# Patient Record
Sex: Female | Born: 1985 | Race: White | Hispanic: No | Marital: Married | Smoking: Never smoker
Health system: Southern US, Community
[De-identification: ages and names within clinical notes are randomized; demographics above are authoritative.]

## PROBLEM LIST (undated history)

## (undated) ENCOUNTER — Inpatient Hospital Stay: Payer: Self-pay

## (undated) DIAGNOSIS — I471 Supraventricular tachycardia: Secondary | ICD-10-CM

## (undated) DIAGNOSIS — I4719 Other supraventricular tachycardia: Secondary | ICD-10-CM

## (undated) HISTORY — PX: ATRIAL ABLATION SURGERY: SHX560

## (undated) HISTORY — PX: OTHER SURGICAL HISTORY: SHX169

---
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
## 2007-04-29 DIAGNOSIS — R002 Palpitations: Secondary | ICD-10-CM | POA: Insufficient documentation

## 2007-04-30 DIAGNOSIS — I498 Other specified cardiac arrhythmias: Secondary | ICD-10-CM | POA: Insufficient documentation

## 2007-07-18 DIAGNOSIS — R079 Chest pain, unspecified: Secondary | ICD-10-CM | POA: Insufficient documentation

## 2008-06-02 ENCOUNTER — Emergency Department: Payer: Self-pay | Admitting: Emergency Medicine

## 2008-09-04 ENCOUNTER — Emergency Department: Payer: Self-pay | Admitting: Unknown Physician Specialty

## 2008-12-06 ENCOUNTER — Observation Stay: Payer: Self-pay

## 2009-01-12 ENCOUNTER — Emergency Department: Payer: Self-pay | Admitting: Unknown Physician Specialty

## 2009-01-13 ENCOUNTER — Emergency Department: Payer: Self-pay | Admitting: Unknown Physician Specialty

## 2009-01-31 ENCOUNTER — Observation Stay: Payer: Self-pay | Admitting: Obstetrics and Gynecology

## 2009-02-14 ENCOUNTER — Observation Stay: Payer: Self-pay

## 2009-02-15 ENCOUNTER — Observation Stay: Payer: Self-pay | Admitting: Unknown Physician Specialty

## 2009-02-25 ENCOUNTER — Observation Stay: Payer: Self-pay | Admitting: Emergency Medicine

## 2009-03-07 ENCOUNTER — Observation Stay: Payer: Self-pay

## 2009-03-25 ENCOUNTER — Inpatient Hospital Stay: Payer: Self-pay | Admitting: Obstetrics & Gynecology

## 2010-12-19 ENCOUNTER — Emergency Department: Payer: Self-pay | Admitting: Unknown Physician Specialty

## 2011-07-25 ENCOUNTER — Emergency Department: Payer: Self-pay | Admitting: Emergency Medicine

## 2011-07-25 LAB — COMPREHENSIVE METABOLIC PANEL
Albumin: 4.2 g/dL (ref 3.4–5.0)
Alkaline Phosphatase: 60 U/L (ref 50–136)
Bilirubin,Total: 0.4 mg/dL (ref 0.2–1.0)
Co2: 23 mmol/L (ref 21–32)
Creatinine: 0.73 mg/dL (ref 0.60–1.30)
EGFR (Non-African Amer.): >60
Glucose: 110 mg/dL — ABNORMAL HIGH (ref 65–99)
Osmolality: 285 (ref 275–301)
SGPT (ALT): 18 U/L
Sodium: 143 mmol/L (ref 136–145)
Total Protein: 7.4 g/dL (ref 6.4–8.2)

## 2011-07-25 LAB — URINALYSIS, COMPLETE
Nitrite: NEGATIVE
Ph: 5 (ref 4.5–8.0)
Protein: 100
Specific Gravity: 1.028 (ref 1.003–1.030)
WBC UR: 9 /HPF (ref 0–5)

## 2011-07-25 LAB — PREGNANCY, URINE: Pregnancy Test, Urine: NEGATIVE m[IU]/mL

## 2011-07-25 LAB — CBC
HCT: 40.8 % (ref 35.0–47.0)
HGB: 13.8 g/dL (ref 12.0–16.0)
MCH: 29.3 pg (ref 26.0–34.0)
MCHC: 33.9 g/dL (ref 32.0–36.0)
MCV: 86 fL (ref 80–100)
Platelet: 175 10*3/uL (ref 150–440)
RBC: 4.72 10*6/uL (ref 3.80–5.20)

## 2011-07-25 LAB — WET PREP, GENITAL

## 2013-01-22 ENCOUNTER — Ambulatory Visit: Payer: Self-pay | Admitting: Podiatry

## 2015-03-14 ENCOUNTER — Other Ambulatory Visit: Payer: Self-pay | Admitting: Family Medicine

## 2015-03-14 ENCOUNTER — Ambulatory Visit
Admission: RE | Admit: 2015-03-14 | Discharge: 2015-03-14 | Disposition: A | Discharge status: Home / Self Care | Payer: 59 | Source: Ambulatory Visit | Attending: Family Medicine | Admitting: Family Medicine

## 2015-03-14 DIAGNOSIS — R221 Localized swelling, mass and lump, neck: Secondary | ICD-10-CM

## 2015-03-14 DIAGNOSIS — R590 Localized enlarged lymph nodes: Secondary | ICD-10-CM | POA: Insufficient documentation

## 2015-04-06 ENCOUNTER — Emergency Department (HOSPITAL_COMMUNITY)
Admission: EM | Admit: 2015-04-06 | Discharge: 2015-04-07 | Disposition: A | Discharge status: Home / Self Care | Payer: 59 | Attending: Emergency Medicine | Admitting: Emergency Medicine

## 2015-04-06 ENCOUNTER — Encounter (HOSPITAL_COMMUNITY): Payer: Self-pay | Admitting: Emergency Medicine

## 2015-04-06 ENCOUNTER — Emergency Department (HOSPITAL_COMMUNITY): Payer: 59

## 2015-04-06 DIAGNOSIS — Y9389 Activity, other specified: Secondary | ICD-10-CM | POA: Diagnosis not present

## 2015-04-06 DIAGNOSIS — Y998 Other external cause status: Secondary | ICD-10-CM | POA: Insufficient documentation

## 2015-04-06 DIAGNOSIS — S93401A Sprain of unspecified ligament of right ankle, initial encounter: Secondary | ICD-10-CM | POA: Diagnosis not present

## 2015-04-06 DIAGNOSIS — Y9289 Other specified places as the place of occurrence of the external cause: Secondary | ICD-10-CM | POA: Insufficient documentation

## 2015-04-06 DIAGNOSIS — W1841XA Slipping, tripping and stumbling without falling due to stepping on object, initial encounter: Secondary | ICD-10-CM | POA: Insufficient documentation

## 2015-04-06 DIAGNOSIS — S99911A Unspecified injury of right ankle, initial encounter: Secondary | ICD-10-CM | POA: Diagnosis present

## 2015-04-06 NOTE — ED Provider Notes (Signed)
CSN: [id number]     Arrival date & time 04/06/15  2250 History  By signing my name below, I, Lyndel Safe, attest that this documentation has been prepared under the direction and in the presence of Ok Edwards, PA-C. Electronically Signed: Lyndel Safe, ED Scribe. 04/06/2015. 11:47 PM.   Chief Complaint  Patient presents with  . Ankle Injury   The history is provided by the patient. No language interpreter was used.   HPI Comments: Alison Myers is a 29 y.o. female who presents to the Emergency Department complaining of sudden onset, constant, throbbing, 7/10, lateral right ankle pain and swelling s/p twist injury that occurred PTA. Pt reports she tripped while stepping over a rope and twisted her right ankle hearing a snap in the process. Pt is ambulating with a family member's crutches. No wound to right ankle.   History reviewed. No pertinent past medical history. History reviewed. No pertinent past surgical history. No family history on file. Social History  Substance Use Topics  . Smoking status: Never Smoker   . Smokeless tobacco: None  . Alcohol Use: Yes   OB History    No data available     Review of Systems  Musculoskeletal: Positive for joint swelling ( right ankle) and arthralgias ( right ankle).  Skin: Negative for wound.  All other systems reviewed and are negative.  Allergies  Demerol  Home Medications   Prior to Admission medications   Not on File   BP 106/75 mmHg  Pulse 87  Temp(Src) 98.7 F (37.1 C)  Resp 16  SpO2 100%  LMP 04/04/2015 Physical Exam  Constitutional: She is oriented to person, place, and time. She appears well-developed and well-nourished. No distress.  HENT:  Head: Normocephalic.  Eyes: Conjunctivae are normal.  Neck: Normal range of motion. Neck supple.  Cardiovascular: Normal rate.   Pulmonary/Chest: Effort normal. No respiratory distress.  Musculoskeletal: Normal range of motion.  Neurological: She is alert and  oriented to person, place, and time. Coordination normal.  Skin: Skin is warm.  Psychiatric: She has a normal mood and affect. Her behavior is normal.  Nursing note and vitals reviewed.   ED Course  Procedures  DIAGNOSTIC STUDIES: Oxygen Saturation is 100% on RA, normal by my interpretation.    COORDINATION OF CARE: 11:47 PM Discussed treatment plan with pt at bedside and pt agreed to plan. Advised RICE therapy and ibuprofen.   Imaging Review Dg Ankle Complete Right  04/06/2015  CLINICAL DATA:  Acute onset of right ankle pain and swelling after twisting injury. Initial encounter. EXAM: RIGHT ANKLE - COMPLETE 3+ VIEW COMPARISON:  None. FINDINGS: There is no evidence of fracture or dislocation. The ankle mortise is intact; the interosseous space is within normal limits. No talar tilt or subluxation is seen. The joint spaces are preserved. No significant soft tissue abnormalities are seen. IMPRESSION: No evidence of fracture or dislocation. Electronically Signed   By: Roanna Raider M.D.   On: 04/06/2015 23:29   I have personally reviewed and evaluated these images as part of my medical decision-making.  MDM   Final diagnoses:  Ankle sprain, right, initial encounter    aso Crutches Ibuprofen    Elson Areas, PA-C 04/06/15 2358  Gwyneth Sprout, MD 04/09/15 [phone]

## 2015-04-06 NOTE — ED Notes (Signed)
Pt. tripped and injured her right ankle with pain and swelling this evening .

## 2015-04-06 NOTE — Discharge Instructions (Signed)
Cryotherapy °Cryotherapy means treatment with cold. Ice or gel packs can be used to reduce both pain and swelling. Ice is the most helpful within the first 24 to 48 hours after an injury or flare-up from overusing a muscle or joint. Sprains, strains, spasms, burning pain, shooting pain, and aches can all be eased with ice. Ice can also be used when recovering from surgery. Ice is effective, has very few side effects, and is safe for most people to use. °PRECAUTIONS  °Ice is not a safe treatment option for people with: °· Raynaud phenomenon. This is a condition affecting small blood vessels in the extremities. Exposure to cold may cause your problems to return. °· Cold hypersensitivity. There are many forms of cold hypersensitivity, including: °· Cold urticaria. Red, itchy hives appear on the skin when the tissues begin to warm after being iced. °· Cold erythema. This is a red, itchy rash caused by exposure to cold. °· Cold hemoglobinuria. Red blood cells break down when the tissues begin to warm after being iced. The hemoglobin that carry oxygen are passed into the urine because they cannot combine with blood proteins fast enough. °· Numbness or altered sensitivity in the area being iced. °If you have any of the following conditions, do not use ice until you have discussed cryotherapy with your caregiver: °· Heart conditions, such as arrhythmia, angina, or chronic heart disease. °· High blood pressure. °· Healing wounds or open skin in the area being iced. °· Current infections. °· Rheumatoid arthritis. °· Poor circulation. °· Diabetes. °Ice slows the blood flow in the region it is applied. This is beneficial when trying to stop inflamed tissues from spreading irritating chemicals to surrounding tissues. However, if you expose your skin to cold temperatures for too long or without the proper protection, you can damage your skin or nerves. Watch for signs of skin damage due to cold. °HOME CARE INSTRUCTIONS °Follow  these tips to use ice and cold packs safely. °· Place a dry or damp towel between the ice and skin. A damp towel will cool the skin more quickly, so you may need to shorten the time that the ice is used. °· For a more rapid response, add gentle compression to the ice. °· Ice for no more than 10 to 20 minutes at a time. The bonier the area you are icing, the less time it will take to get the benefits of ice. °· Check your skin after 5 minutes to make sure there are no signs of a poor response to cold or skin damage. °· Rest 20 minutes or more between uses. °· Once your skin is numb, you can end your treatment. You can test numbness by very lightly touching your skin. The touch should be so light that you do not see the skin dimple from the pressure of your fingertip. When using ice, most people will feel these normal sensations in this order: cold, burning, aching, and numbness. °· Do not use ice on someone who cannot communicate their responses to pain, such as small children or people with dementia. °HOW TO MAKE AN ICE PACK °Ice packs are the most common way to use ice therapy. Other methods include ice massage, ice baths, and cryosprays. Muscle creams that cause a cold, tingly feeling do not offer the same benefits that ice offers and should not be used as a substitute unless recommended by your caregiver. °To make an ice pack, do one of the following: °· Place crushed ice or a   bag of frozen vegetables in a sealable plastic bag. Squeeze out the excess air. Place this bag inside another plastic bag. Slide the bag into a pillowcase or place a damp towel between your skin and the bag. °· Mix 3 parts water with 1 part rubbing alcohol. Freeze the mixture in a sealable plastic bag. When you remove the mixture from the freezer, it will be slushy. Squeeze out the excess air. Place this bag inside another plastic bag. Slide the bag into a pillowcase or place a damp towel between your skin and the bag. °SEEK MEDICAL CARE  IF: °· You develop white spots on your skin. This may give the skin a blotchy (mottled) appearance. °· Your skin turns blue or pale. °· Your skin becomes waxy or hard. °· Your swelling gets worse. °MAKE SURE YOU:  °· Understand these instructions. °· Will watch your condition. °· Will get help right away if you are not doing well or get worse. °  °This information is not intended to replace advice given to you by your health care provider. Make sure you discuss any questions you have with your health care provider. °  °Document Released: 01/29/2011 Document Revised: 06/25/2014 Document Reviewed: 01/29/2011 °Elsevier Interactive Patient Education ©2016 [employer]vier Inc[employer] ° °Ankle Sprain °An ankle sprain is an injury to the strong, fibrous tissues (ligaments) that hold the bones of your ankle joint together.  °CAUSES °An ankle sprain is usually caused by a fall or by twisting your ankle. Ankle sprains most commonly occur when you step on the outer edge of your foot, and your ankle turns inward. People who participate in sports are more prone to these types of injuries.  °SYMPTOMS  °· Pain in your ankle. The pain may be present at rest or only when you are trying to stand or walk. °· Swelling. °· Bruising. Bruising may develop immediately or within 1 to 2 days after your injury. °· Difficulty standing or walking, particularly when turning corners or changing directions. °DIAGNOSIS  °Your caregiver will ask you details about your injury and perform a physical exam of your ankle to determine if you have an ankle sprain. During the physical exam, your caregiver will press on and apply pressure to specific areas of your foot and ankle. Your caregiver will try to move your ankle in certain ways. An X-ray exam may be done to be sure a bone was not broken or a ligament did not separate from one of the bones in your ankle (avulsion fracture).  °TREATMENT  °Certain types of braces can help stabilize your ankle. Your caregiver can  make a recommendation for this. Your caregiver may recommend the use of medicine for pain. If your sprain is severe, your caregiver may refer you to a surgeon who helps to restore function to parts of your skeletal system (orthopedist) or a physical therapist. °HOME CARE INSTRUCTIONS  °· Apply ice to your injury for 1-2 days or as directed by your caregiver. Applying ice helps to reduce inflammation and pain. °¨ Put ice in a plastic bag. °¨ Place a towel between your skin and the bag. °¨ Leave the ice on for 15-20 minutes at a time, every 2 hours while you are awake. °· Only take over-the-counter or prescription medicines for pain, discomfort, or fever as directed by your caregiver. °· Elevate your injured ankle above the level of your heart as much as possible for 2-3 days. °· If your caregiver recommends crutches, use them as instructed. Gradually put weight   on the affected ankle. Continue to use crutches or a cane until you can walk without feeling pain in your ankle. °· If you have a plaster splint, wear the splint as directed by your caregiver. Do not rest it on anything harder than a pillow for the first 24 hours. Do not put weight on it. Do not get it wet. You may take it off to take a shower or bath. °· You may have been given an elastic bandage to wear around your ankle to provide support. If the elastic bandage is too tight (you have numbness or tingling in your foot or your foot becomes cold and blue), adjust the bandage to make it comfortable. °· If you have an air splint, you may blow more air into it or let air out to make it more comfortable. You may take your splint off at night and before taking a shower or bath. Wiggle your toes in the splint several times per day to decrease swelling. °SEEK MEDICAL CARE IF:  °· You have rapidly increasing bruising or swelling. °· Your toes feel extremely cold or you lose feeling in your foot. °· Your pain is not relieved with medicine. °SEEK IMMEDIATE MEDICAL CARE  IF: °· Your toes are numb or blue. °· You have severe pain that is increasing. °MAKE SURE YOU:  °· Understand these instructions. °· Will watch your condition. °· Will get help right away if you are not doing well or get worse. °  °This information is not intended to replace advice given to you by your health care provider. Make sure you discuss any questions you have with your health care provider. °  °Document Released: 06/04/2005 Document Revised: 06/25/2014 Document Reviewed: 06/16/2011 °Elsevier Interactive Patient Education ©2016 Elsevier Inc. ° °

## 2015-04-06 NOTE — ED Notes (Signed)
Patient transported to X-ray 

## 2015-04-07 MED ORDER — IBUPROFEN 400 MG PO TABS
800.0000 mg | ORAL_TABLET | Freq: Once | ORAL | Status: AC
  Administered 2015-04-07: 800 mg via ORAL
  Filled 2015-04-07: qty 2

## 2015-04-07 NOTE — ED Notes (Signed)
Patient is alert and orientedx4.  Patient was explained discharge instructions and they understood them with no questions.   

## 2015-08-04 ENCOUNTER — Emergency Department
Admission: EM | Admit: 2015-08-04 | Discharge: 2015-08-04 | Disposition: A | Discharge status: Home / Self Care | Payer: Commercial Managed Care - PPO | Attending: Emergency Medicine | Admitting: Emergency Medicine

## 2015-08-04 ENCOUNTER — Encounter: Payer: Self-pay | Admitting: *Deleted

## 2015-08-04 DIAGNOSIS — R51 Headache: Secondary | ICD-10-CM | POA: Diagnosis not present

## 2015-08-04 DIAGNOSIS — Z3A13 13 weeks gestation of pregnancy: Secondary | ICD-10-CM | POA: Diagnosis not present

## 2015-08-04 DIAGNOSIS — O9989 Other specified diseases and conditions complicating pregnancy, childbirth and the puerperium: Secondary | ICD-10-CM | POA: Insufficient documentation

## 2015-08-04 DIAGNOSIS — R079 Chest pain, unspecified: Secondary | ICD-10-CM | POA: Insufficient documentation

## 2015-08-04 DIAGNOSIS — R519 Headache, unspecified: Secondary | ICD-10-CM

## 2015-08-04 HISTORY — DX: Supraventricular tachycardia: I47.1

## 2015-08-04 HISTORY — DX: Other supraventricular tachycardia: I47.19

## 2015-08-04 LAB — CBC
HEMATOCRIT: 35.6 % (ref 35.0–47.0)
Hemoglobin: 12.3 g/dL (ref 12.0–16.0)
MCH: 28.4 pg (ref 26.0–34.0)
MCHC: 34.7 g/dL (ref 32.0–36.0)
MCV: 82 fL (ref 80.0–100.0)
PLATELETS: 214 10*3/uL (ref 150–440)
RBC: 4.34 MIL/uL (ref 3.80–5.20)
RDW: 12.9 % (ref 11.5–14.5)
WBC: 9 10*3/uL (ref 3.6–11.0)

## 2015-08-04 LAB — BASIC METABOLIC PANEL
Anion gap: 7 (ref 5–15)
BUN: 8 mg/dL (ref 6–20)
CO2: 22 mmol/L (ref 22–32)
Calcium: 9 mg/dL (ref 8.9–10.3)
Chloride: 106 mmol/L (ref 101–111)
Creatinine, Ser: 0.43 mg/dL — ABNORMAL LOW (ref 0.44–1.00)
Glucose, Bld: 87 mg/dL (ref 65–99)
Potassium: 3.5 mmol/L (ref 3.5–5.1)
SODIUM: 135 mmol/L (ref 135–145)

## 2015-08-04 LAB — TROPONIN I: Troponin I: <0.03 ng/mL (ref <0.031)

## 2015-08-04 MED ORDER — ACETAMINOPHEN 500 MG PO TABS
1000.0000 mg | ORAL_TABLET | Freq: Once | ORAL | Status: AC
  Administered 2015-08-04: 1000 mg via ORAL
  Filled 2015-08-04: qty 2

## 2015-08-04 MED ORDER — METOCLOPRAMIDE HCL 10 MG PO TABS: 10.0000 mg | ORAL_TABLET | Freq: Three times a day (TID) | ORAL | Status: DC

## 2015-08-04 MED ORDER — METOCLOPRAMIDE HCL 5 MG/ML IJ SOLN
10.0000 mg | Freq: Once | INTRAMUSCULAR | Status: AC
  Administered 2015-08-04: 10 mg via INTRAVENOUS
  Filled 2015-08-04: qty 2

## 2015-08-04 MED ORDER — HYDROMORPHONE HCL 1 MG/ML IJ SOLN
0.5000 mg | Freq: Once | INTRAMUSCULAR | Status: AC
  Administered 2015-08-04: 0.5 mg via INTRAVENOUS
  Filled 2015-08-04: qty 1

## 2015-08-04 MED ORDER — SODIUM CHLORIDE 0.9 % IV BOLUS (SEPSIS)
1000.0000 mL | Freq: Once | INTRAVENOUS | Status: AC
  Administered 2015-08-04: 1000 mL via INTRAVENOUS

## 2015-08-04 NOTE — Discharge Instructions (Signed)
General Headache Without Cause °A headache is pain or discomfort felt around the head or neck area. There are many causes and types of headaches. In some cases, the cause may not be found.  °HOME CARE  °Managing Pain °· Take over-the-counter and prescription medicines only as told by your doctor. °· Lie down in a dark, quiet room when you have a headache. °· If directed, apply ice to the head and neck area: °¨ Put ice in a plastic bag. °¨ Place a towel between your skin and the bag. °¨ Leave the ice on for 20 minutes, 2-3 times per day. °· Use a heating pad or hot shower to apply heat to the head and neck area as told by your doctor. °· Keep lights dim if bright lights bother you or make your headaches worse. °Eating and Drinking °· Eat meals on a regular schedule. °· Lessen how much alcohol you drink. °· Lessen how much caffeine you drink, or stop drinking caffeine. °General Instructions °· Keep all follow-up visits as told by your doctor. This is important. °· Keep a journal to find out if certain things bring on headaches. For example, write down: °¨ What you eat and drink. °¨ How much sleep you get. °¨ Any change to your diet or medicines. °· Relax by getting a massage or doing other relaxing activities. °· Lessen stress. °· Sit up straight. Do not tighten (tense) your muscles. °· Do not use tobacco products. This includes cigarettes, chewing tobacco, or e-cigarettes. If you need help quitting, ask your doctor. °· Exercise regularly as told by your doctor. °· Get enough sleep. This often means 7-9 hours of sleep. °GET HELP IF: °· Your symptoms are not helped by medicine. °· You have a headache that feels different than the other headaches. °· You feel sick to your stomach (nauseous) or you throw up (vomit). °· You have a fever. °GET HELP RIGHT AWAY IF:  °· Your headache becomes really bad. °· You keep throwing up. °· You have a stiff neck. °· You have trouble seeing. °· You have trouble speaking. °· You have  pain in the eye or ear. °· Your muscles are weak or you lose muscle control. °· You lose your balance or have trouble walking. °· You feel like you will pass out (faint) or you pass out. °· You have confusion. °  °This information is not intended to replace advice given to you by your health care provider. Make sure you discuss any questions you have with your health care provider. °  °Document Released: 03/13/2008 Document Revised: 02/23/2015 Document Reviewed: 09/27/2014 °Elsevier Interactive Patient Education ©2016 Elsevier Inc. ° °Please return immediately if condition worsens. Please contact her primary physician or the physician you were given for referral. If you have any specialist physicians involved in her treatment and plan please also contact them. Thank you for using [facility]. ° °

## 2015-08-04 NOTE — ED Notes (Signed)
Pt is 13 weeks pregnet, states headache  And vomiting yesterday and this AM chest pain began, denies SOB, states hx of atrial tachycardia, states she is to have a pacemaker or ablation done after delivery, states hx of 2 atrial ablations, pt is to see cards at [facility] next week

## 2015-08-04 NOTE — ED Provider Notes (Signed)
Time Seen: Approximately 1242  I have reviewed the triage notes  Chief Complaint: Chest Pain   History of Present Illness: Alison Myers is a 30 y.o. female who presents with first initially a headache that started last evening that was somewhat global in nature. She states she did have some mild neck pain and some photophobia. She states neck pain and photophobia result at this point. Nausea no persistent vomiting. Today she had some vomiting and had some substernal chest discomfort shortly thereafter. She denies any shortness of breath, fever, cough. Patient is gravida 2 para 1 approximately [redacted] weeks pregnant. Patient denies any complications with the pregnancy up to this point. She denies any vaginal discharge or bleeding. Patient has had 2 previous ablation therapies in the past for supraventricular tachycardia. She was advised that she'll likely need another ablation therapy and/or pacemaker after this pregnancy. She is currently followed by cardiology and also has referral to the high risk clinic at [facility].   Past Medical History  Diagnosis Date  . Atrial tachycardia (HCC)     There are no active problems to display for this patient.   History reviewed. No pertinent past surgical history.  History reviewed. No pertinent past surgical history.  No current outpatient prescriptions on file.  Allergies:  Demerol  Family History: History reviewed. No pertinent family history.  Social History: Social History  Substance Use Topics  . Smoking status: Never Smoker   . Smokeless tobacco: None  . Alcohol Use: Yes     Review of Systems:   10 point review of systems was performed and was otherwise negative:  Constitutional: No fever no head trauma Eyes: No visual disturbances ENT: No sore throat, ear pain Cardiac: Mild substernal without radiation to the back or flank area Respiratory: No shortness of breath, wheezing, or stridor Abdomen: No abdominal pain, no vomiting, No  diarrhea Endocrine: No weight loss, No night sweats Extremities: No peripheral edema, cyanosis Skin: No rashes, easy bruising Neurologic: No focal weakness, trouble with speech or swollowing Urologic: No dysuria, Hematuria, or urinary frequency No vaginal discharge or bleeding  Physical Exam:  ED Triage Vitals  Enc Vitals Group     BP 08/04/15 1207 137/84 mmHg     Pulse Rate 08/04/15 1207 117     Resp 08/04/15 1207 20     Temp 08/04/15 1207 98.2 F (36.8 C)     Temp Source 08/04/15 1207 Oral     SpO2 08/04/15 1207 100 %     Weight 08/04/15 1206 180 lb (81.647 kg)     Height 08/04/15 1206 5\' 2"  (1.575 m)     Head Cir --      Peak Flow --      Pain Score 08/04/15 1206 6     Pain Loc --      Pain Edu? --      Excl. in GC? --     General: Awake , Alert , and Oriented times 3; GCS 15 Head: Normal cephalic , atraumatic Eyes: Pupils equal , round, reactive to light Nose/Throat: No nasal drainage, patent upper airway without erythema or exudate.  Neck: Supple, Full range of motion, No anterior adenopathy or palpable thyroid masses Lungs: Clear to ascultation without wheezes , rhonchi, or rales Heart: Regular rate, regular rhythm without murmurs , gallops , or rubs Abdomen: Soft, non tender without rebound, guarding , or rigidity; bowel sounds positive and symmetric in all 4 quadrants. No organomegaly .  Extremities: 2 plus symmetric pulses. No edema, clubbing or cyanosis Neurologic: normal ambulation, Motor symmetric without deficits, sensory intact Skin: warm, dry, no rashes   Labs:   All laboratory work was reviewed including any pertinent negatives or positives listed below:  Labs Reviewed  BASIC METABOLIC PANEL - Abnormal; Notable for the following:    Creatinine, Ser 0.43 (*)    All other components within normal limits  CBC  TROPONIN I   review laboratory work shows no significant findings  EKG:  ED ECG REPORT I, Jennye Moccasin, the attending physician,  personally viewed and interpreted this ECG.  Date: 08/04/2015 EKG Time: 1208   Rate: 117 Rhythm: Sinus tachycardia otherwise normal EKG  QRS Axis: normal Intervals: normal ST/T Wave abnormalities: normal Conduction Disturbances: none Narrative Interpretation: unremarkable      ED Course:  Patient was treated as a migraine type headache with IV fluids, anti-medic medication, low-dose Dilaudid and some Tylenol. His troponin is negative and her EKG looks fine and felt this was unlikely to be a life-threatening cause of chest pain such as cardiac or pulmonary embolism. Patient feels somewhat symptomatically improved as far as her headache goes and didn't have a history of previous headaches associated with her last pregnancy which may be hormonal in nature. She is otherwise hemodynamically stable and I felt could be treated on an outpatient basis. She was advised to touch base with her OB/GYN's following her up currently for her pregnancy.    Assessment:  Acute unspecified cephalgia      Plan: * Patient was advised to return immediately if condition worsens. Patient was advised to follow up with their primary care physician or other specialized physicians involved in their outpatient care            Jennye Moccasin, MD 08/04/15 [phone]

## 2015-08-11 ENCOUNTER — Ambulatory Visit
Admission: RE | Admit: 2015-08-11 | Discharge: 2015-08-11 | Disposition: A | Discharge status: Home / Self Care | Payer: 59 | Source: Ambulatory Visit | Attending: Obstetrics and Gynecology | Admitting: Obstetrics and Gynecology

## 2015-08-11 VITALS — BP 137/79 | HR 105 | Resp 18 | Wt 181.4 lb

## 2015-08-11 DIAGNOSIS — Z3482 Encounter for supervision of other normal pregnancy, second trimester: Secondary | ICD-10-CM | POA: Diagnosis not present

## 2015-08-11 DIAGNOSIS — Z8249 Family history of ischemic heart disease and other diseases of the circulatory system: Secondary | ICD-10-CM

## 2015-08-11 DIAGNOSIS — L259 Unspecified contact dermatitis, unspecified cause: Secondary | ICD-10-CM

## 2015-08-11 DIAGNOSIS — Z8279 Family history of other congenital malformations, deformations and chromosomal abnormalities: Secondary | ICD-10-CM | POA: Insufficient documentation

## 2015-08-11 DIAGNOSIS — R000 Tachycardia, unspecified: Secondary | ICD-10-CM

## 2015-08-11 DIAGNOSIS — Z8489 Family history of other specified conditions: Secondary | ICD-10-CM | POA: Insufficient documentation

## 2015-08-11 DIAGNOSIS — Z3480 Encounter for supervision of other normal pregnancy, unspecified trimester: Secondary | ICD-10-CM | POA: Insufficient documentation

## 2015-08-11 DIAGNOSIS — Z8679 Personal history of other diseases of the circulatory system: Secondary | ICD-10-CM | POA: Insufficient documentation

## 2015-08-11 MED ORDER — FLUOCINONIDE-E 0.05 % EX CREA: 1.0000 "application " | TOPICAL_CREAM | Freq: Two times a day (BID) | CUTANEOUS | Status: DC

## 2015-08-11 MED ORDER — METOPROLOL SUCCINATE ER 25 MG PO TB24: 50.0000 mg | ORAL_TABLET | Freq: Every day | ORAL | Status: DC

## 2015-08-11 NOTE — Progress Notes (Signed)
Referring physician:  [facility] Length of Consultation:  45 minutes  Alison Myers was seen today at [facility] for an MFM consultation due to her history of SVT.  During that visit, the patient disclosed a significant family history of heart attacks and a diagnosis of Hyper IgE in her daughter, so she was also seen for genetic counseling.    We obtained a detailed family history.  The patient reported multiple family members with multipole heart attacks at young ages, as follows: Her brother had a heart attack at 28 years of age. Her mother has had two heart attacks, at 28 and 48 years. Three maternal uncles have all had 3-4 heart attacks each, beginning in their 28s. One maternal first cousin has had two heart attacks, with the first at 28 years old. The paternal grandfather died of a massive heart attack at age 28 but had 4 in his life, with the first at age 28. Interestingly, her father (who is unrelated to her mother) died during stint placement at age 28 after two heart attacks.  She made it a point to say that several of the relatives did NOT have high cholesterol, some were obese and diabetic, but others were not.  I have reached out to the adult cardiovascular genetics physicians at [facility] to determine the most appropriate referral for this family.  When I spoke with that patient later today by phone, she also mentioned that one of these relatives was told the heart attacks were due to "flat platelets", so that patient is trying to get more information on that diagnosis and will email me.  I will update this note when more is learned.    The couple has a 35 year old daughter, Bradd Canary Bloom (dob 03/27/09) who has been diagnosed with hyper IgE syndrome after numerous evaluations and studies at [facility].  The patient will complete a medical record release form to allow [place] to obtain those records.  There are several types of hyper IgE syndromes, some of which may be inherited (at  least one dominant and one that is recessive).  Once those records become available, we will revisit this issue with the family and determine risks to this pregnancy to have a similar conditions.  We will follow up with Ms. Haber at her next visit in 4 weeks, or before if more information is obtained.  The patient had already declined screening and testing in this pregnancy for aneuploidy and elects to have ultrasound only to screening for birth defects.  She stated that no findings would change her plans for the pregnancy and may stand to only increased her anxiety.  We will append this note as information is obtained.  We may be reached at [phone].  Cherly Anderson, MS, CGC

## 2015-08-11 NOTE — Addendum Note (Signed)
Encounter addended by: Jimmey Ralph, MD on: 08/11/2015  2:49 PM<BR>     Documentation filed: Clinical Notes, Notes Section, Problem List

## 2015-08-11 NOTE — Progress Notes (Addendum)
[place] Consultation   Chief Complaint: tachycardia in pregnancy  HPI: [name] is a 30 y.o. G2P1001 at 87w4dby LMP and [place] scan [facility].  [facility] [place]  ([phone]) The patient reports she was diagnosed with heart problems at age 89when she developed chest pain . During endoscopy to r/o a gastric etiology she was noted to have a HR of 270. This led to an echo, EKG and several Holter monitors. She was diangosed with a paroxysmal atrial tachycardia. She underwent ablations in 2009 and 2012 . She reports she has a baseline tachycardia and even on metoprolol usually runs between 100-120 . She has never fainted from tachycardia ( she fainted once on the Atkins diet due to "low blood sugar") . She and Dr KDot Laneshave discussed whether ablation and a pacemaker might decrease long-term strain on her heart . Pt notes that evan mild exercise makes her heart race - she pushes on anyway.  She used to work at [employer] quit because of her daughters medical issues . For her first pregnancy delivery 03/2009 with her daughter Alison Myers she had less nausea than with this one . She made it to  what she considered her EDD .she used metoprolol . New born  KCroatiaweighed 6 lb 8 oz she was healthy but required a 3 d stay for jaundice . Patient reports her epidural didn't work well - they tried 3 times . She notes she had tremendous fluid gain and went on po Lasix for 50 lbs of water weight pp. Otherwise she tolerated labor well .   Her daughter Alison Simashad rashes and food intolerance - [facility]. Her pediatrician diagnosed her with hyper Ig E . [place] [facility] [place] Her daughter is small but doing well - she likes to dress up as a cTherapist, sports She is on no meds. NDaionnawas unable to breastfeed - poor production of milk.   NLaporcha notes she has daily N&V during this pregnancy and has goen to the ER .  She recently went to the ER for a viral problem with HA and had a negative evaluation . She understands the need to hydrate .  Pregnancy was unplanned but she is excited - declines gen testing - but knows it is a boy   Recent normal echo 2/7/2017with Dr KDot LanesEF 65% normal structure EKG normal HR92    Past Medical History: Patient  has a past medical history of Atrial tachy[place] ([place] and [place] (30[place]).  Past Surgical History: She  has past surgical history that includes Bone graft hip iliac crest (Left, 2000) and Atrial ablation surgery (2009/2012).  Obstetric History:  OB History    Gravida Para Term Preterm AB TAB SAB Ectopic Multiple Living   _0 Gynecologic History:  Patient's last menstrual period was 04/04/2015.    Medications: vits , folic acid , needs metoprolol Rx  Allergies: Patient is allergic to demerol.  Social History: Patient  reports that she has never smoked. She has never used smokeless tobacco. She reports that she does not drink alcohol or use illicit drugs.  Family History: family history is not on file.  Review of Systems A full 12  point review of systems was negative or as noted in the History of Present Illness.  Physical Exam: BP 137/79 mmHg  Pulse 105  Resp 18  Wt 181 lb 6.4 oz (82.283 kg)  SpO2 100%  LMP 04/04/2015 Mildly obese talkative WF  Here with her husband. Contact dermatitis on arm   Asessement: Patient Active Problem List   Diagnosis Date Noted  . pregnancy 08/11/2015  . H/O sinus tachycardia 08/11/2015  . daughter with Hyper IgE syndrome 08/11/2015   Contact dermatitis Plan: Rx metoprolol XR 50 mg daily - will likely need increase - I reviewed risks to pregnancy including SGA infant, exacerbation of glucose intolerance (pt barely passed early screen) , lowering of fetal HR, theoretical impact on preterm labor  I called Dr  Dot Lanes and left a message regarding my Rx   [facility] at [facility] - consult with [place] [facility] for OB anesthesia and Alison Myers early in third trimester for delivery planning  Transfer care at 34-36 weeks in preparation for delivery Consider telemetry and early epidural  Rx lidex for arm rash    Strong family h/o heart disease  She met with the genetic counselor  Also CHD in a nephew - consider fetal echo      Total time spent with the patient was 30 minutes with greater than 50% spent in counseling and coordination of care. We appreciate this interesting consult and will be happy to be involved in the ongoing care of Alison Myers in anyway her obstetricians desire.  Gatha Mayer, MD [facility]

## 2015-08-11 NOTE — Progress Notes (Signed)
Prescription for Lidex fluocuonide ointment 0.05%, apply bid to affected area, called into [facility] in [place] per Dr. Vivien Rossetti order

## 2015-08-11 NOTE — Progress Notes (Addendum)
Add  Strong family h/o heart disease  She met with the genetic counselor  Also CHD in a nephew - consider fetal echo

## 2015-08-11 NOTE — Discharge Instructions (Signed)
Postural Orthostatic Tachycardia Syndrome  Postural orthostatic tachycardia syndrome (POTS) is an increased heart rate when going from a lying (supine) position to a standing position. The heart rate may increase more than 30 beats per minute (BPM) above its resting rate when going from a lying to a standing position. POTS occurs more frequently in women than in men.   SYMPTOMS   POTS symptoms may be increased in the morning. Symptoms of POTS include:  · Fainting or near fainting.  · Inability to think clearly.  · Extreme or chronic fatigue.  · Exercise intolerance.  · Chest pain.  · Having the lower legs develop a reddish-blue color due to decreased blood flow (acrocyanosis).  CAUSES  POTS can be caused by different conditions. Sometimes, it has no known cause (idiopathic). Some causes of POTS include:  · Viral illness.  · Pregnancy.  · Autoimmune diseases.  · Medications.  · Major surgery.  · Trauma such as a car accident or major injury.  · Medical conditions such as anemia, dehydration, and hyperthyroidism.  DIAGNOSIS   POTS is diagnosed by:  · Taking a complete history and physical exam.  · Measuring the heart rate while lying and then upon standing.  · Measuring blood pressure when going from a lying to a standing position. POTS is usually not associated with low blood pressure (orthostatic hypotension) when going from a lying to standing position. While standing, blood pressure should be taken 2, 5, and 10 minutes after getting up.  TREATMENT   Treatment of POTS depends upon the severity of the symptoms. Treatment includes:  · Drinking plenty of fluids to avoid getting dehydrated.  · Avoiding very hot environments to not get overheated.  · Increasing your dietary salt intake as instructed by your caregiver.  · Taking different types of medications as prescribed for POTS.  · Avoiding some classes of medications such as vasodilators and diuretics.  SEEK IMMEDIATE MEDICAL CARE IF  · You have severe chest pain  that does not go away. Call your local emergency service immediately.  · You feel your heart racing or beating rapidly.  · You feel like passing out.  · You have very confused thinking.  MAKE SURE YOU  · Understand these instructions.  · Will watch your condition.  · Will get help right away if you are not doing well or get worse.     This information is not intended to replace advice given to you by your health care provider. Make sure you discuss any questions you have with your health care provider.     Document Released: 05/25/2002 Document Revised: 06/25/2014 Document Reviewed: 08/02/2010  Elsevier Interactive Patient Education ©2016 Elsevier Inc.

## 2015-08-22 ENCOUNTER — Telehealth: Payer: Self-pay

## 2015-09-08 ENCOUNTER — Other Ambulatory Visit: Payer: Self-pay

## 2015-09-08 ENCOUNTER — Ambulatory Visit
Admission: RE | Admit: 2015-09-08 | Discharge: 2015-09-08 | Disposition: A | Discharge status: Home / Self Care | Payer: Commercial Managed Care - PPO | Source: Ambulatory Visit | Attending: Maternal & Fetal Medicine | Admitting: Maternal & Fetal Medicine

## 2015-09-08 VITALS — BP 137/61 | HR 109 | Temp 98.4°F | Resp 18 | Wt 187.0 lb

## 2015-09-08 DIAGNOSIS — Z79899 Other long term (current) drug therapy: Secondary | ICD-10-CM | POA: Diagnosis not present

## 2015-09-08 DIAGNOSIS — O09892 Supervision of other high risk pregnancies, second trimester: Secondary | ICD-10-CM | POA: Insufficient documentation

## 2015-09-08 DIAGNOSIS — Z8279 Family history of other congenital malformations, deformations and chromosomal abnormalities: Secondary | ICD-10-CM

## 2015-09-08 DIAGNOSIS — Z3482 Encounter for supervision of other normal pregnancy, second trimester: Secondary | ICD-10-CM

## 2015-09-08 DIAGNOSIS — R000 Tachycardia, unspecified: Secondary | ICD-10-CM | POA: Insufficient documentation

## 2015-09-08 DIAGNOSIS — Z36 Encounter for antenatal screening of mother: Secondary | ICD-10-CM | POA: Insufficient documentation

## 2015-09-08 DIAGNOSIS — Z3A18 18 weeks gestation of pregnancy: Secondary | ICD-10-CM | POA: Diagnosis not present

## 2015-09-08 DIAGNOSIS — O359XX1 Maternal care for (suspected) fetal abnormality and damage, unspecified, fetus 1: Secondary | ICD-10-CM

## 2015-09-08 DIAGNOSIS — Z8679 Personal history of other diseases of the circulatory system: Secondary | ICD-10-CM

## 2015-11-17 ENCOUNTER — Ambulatory Visit: Payer: Commercial Managed Care - PPO

## 2015-11-25 DIAGNOSIS — B009 Herpesviral infection, unspecified: Secondary | ICD-10-CM | POA: Insufficient documentation

## 2015-11-30 DIAGNOSIS — R000 Tachycardia, unspecified: Secondary | ICD-10-CM | POA: Insufficient documentation

## 2015-12-13 ENCOUNTER — Observation Stay
Admission: EM | Admit: 2015-12-13 | Discharge: 2015-12-13 | Payer: Commercial Managed Care - PPO | Attending: Obstetrics & Gynecology | Admitting: Obstetrics & Gynecology

## 2015-12-13 ENCOUNTER — Ambulatory Visit (HOSPITAL_COMMUNITY)
Admission: AD | Admit: 2015-12-13 | Discharge: 2015-12-13 | Disposition: A | Discharge status: Home / Self Care | Payer: Commercial Managed Care - PPO | Source: Other Acute Inpatient Hospital | Attending: Obstetrics & Gynecology | Admitting: Obstetrics & Gynecology

## 2015-12-13 DIAGNOSIS — Z885 Allergy status to narcotic agent status: Secondary | ICD-10-CM | POA: Insufficient documentation

## 2015-12-13 DIAGNOSIS — I471 Supraventricular tachycardia: Secondary | ICD-10-CM | POA: Diagnosis not present

## 2015-12-13 DIAGNOSIS — Z8249 Family history of ischemic heart disease and other diseases of the circulatory system: Secondary | ICD-10-CM | POA: Insufficient documentation

## 2015-12-13 DIAGNOSIS — R000 Tachycardia, unspecified: Secondary | ICD-10-CM | POA: Insufficient documentation

## 2015-12-13 DIAGNOSIS — O99413 Diseases of the circulatory system complicating pregnancy, third trimester: Principal | ICD-10-CM | POA: Insufficient documentation

## 2015-12-13 DIAGNOSIS — R200 Anesthesia of skin: Secondary | ICD-10-CM | POA: Insufficient documentation

## 2015-12-13 DIAGNOSIS — Z3A32 32 weeks gestation of pregnancy: Secondary | ICD-10-CM | POA: Diagnosis not present

## 2015-12-13 MED ORDER — ACETAMINOPHEN 325 MG PO TABS
650.0000 mg | ORAL_TABLET | ORAL | Status: DC | PRN
Start: 2015-12-13 — End: 2015-12-13

## 2015-12-13 MED ORDER — ASPIRIN 81 MG PO CHEW
81.0000 mg | CHEWABLE_TABLET | Freq: Once | ORAL | Status: AC
  Administered 2015-12-13: 81 mg via ORAL
  Filled 2015-12-13 (×2): qty 1

## 2015-12-13 MED ORDER — ONDANSETRON HCL 4 MG/2ML IJ SOLN: 4.0000 mg | Freq: Four times a day (QID) | INTRAMUSCULAR | Status: DC | PRN

## 2015-12-13 NOTE — OB Triage Note (Signed)
Pt sent up from ER for HR issues but after talking with patient pt. States that she is have high heart rate , chest pain and some numbness in her left hand.  Spoke with Dr. Tiburcio Pea about pts. Complaint and he states pt.needs to be evaluated in ER after documenting a Fetal heart rate.   Doppler heart rate is 150.

## 2015-12-13 NOTE — OB Triage Note (Signed)
Report called to [facility] Triage.

## 2015-12-13 NOTE — Plan of Care (Signed)
Report to MD/ states he is coming up to see pt. Pt states she does not want to go to ER here and wants to go to [facility]. Ellison Carwin RNC

## 2015-12-13 NOTE — H&P (Signed)
Obstetrics Admission History & Physical   No chief complaint on file.   HPI:  30 y.o. G2P1001 @ [redacted]w[redacted]d (02/05/2016, by Patient Reported). Admitted on 12/13/2015:   Patient Active Problem List   Diagnosis Date Noted  . Current use of beta blocker 09/08/2015  . pregnancy 08/11/2015  . H/O sinus tachycardia 08/11/2015  . daughter with Hyper IgE syndrome 08/11/2015  . Family history of myocardial infarction at age less than 52 08/11/2015  . Family history of congenital heart defect 08/11/2015     Presents for heart racing, chest pain and left hand numbness.  Prenatal care at: other testing: [facility]  PMHx:  Past Medical History  Diagnosis Date  . Atrial tachycardia (HCC)   . Atrial tachycardia (HCC) 30 years old   PSHx:  Past Surgical History  Procedure Laterality Date  . Bone graft hip iliac crest Left 2000  . Atrial ablation surgery  2009/2012   Medications:  Prescriptions prior to admission  Medication Sig Dispense Refill Last Dose  . acetaminophen (TYLENOL) 500 MG tablet Take 1,000 mg by mouth every 6 (six) hours as needed.   Taking  . metoprolol succinate (TOPROL XL) 25 MG 24 hr tablet Take 2 tablets (50 mg total) by mouth daily. 60 tablet 11 Taking  . Prenatal Vit-Fe Fumarate-FA (MULTIVITAMIN-PRENATAL) 27-0.8 MG TABS tablet Take 1 tablet by mouth daily at 12 noon.   Taking  . promethazine (PHENERGAN) 12.5 MG tablet Take 12.5 mg by mouth every 6 (six) hours as needed for nausea or vomiting.   Taking   Allergies: is allergic to demerol. OBHx:  OB History  Gravida Para Term Preterm AB SAB TAB Ectopic Multiple Living  2 1 1       1     # Outcome Date GA Lbr Len/2nd Weight Sex Delivery Anes PTL Lv  2 Current           1 Term 03/27/09    F Vag-Spont   Y     ELF:YBOFBPZW/CHENIDPOEUMP except as detailed in HPI. Soc Hx: Pregnancy welcomed  Objective:  There were no vitals filed for this visit. General: Well nourished, well developed female in no acute distress.   Skin: Warm and dry.  Cardiovascular:Regular rate and rhythm. Respiratory: Clear to auscultation bilateral. Normal respiratory effort Abdomen: mild Neuro/Psych: Normal mood and affect.   EFM:FHR: 150 bpm   Assessment & Plan:   30 y.o. G2P1001 @ [redacted]w[redacted]d, Admitted on 12/13/2015: SVT, with sx's of pain and left hand numbness. Discussed with Dr Leone Brand of [facility], accepting transfer as she has been seen at [facility] by MFM and Cardiology there multiple occasions for this condition. EKG, ASA precautionary.    FWR currently, good FHTs Pt stable for transfer.

## 2015-12-13 NOTE — Discharge Summary (Signed)
Physician Discharge Summary  Patient ID: Alison Myers MRN: [id number] DOB/AGE: 1986/04/25 30 y.o.  Admit date: 12/13/2015 Discharge date: 12/13/2015  Admission Diagnoses:  Discharge Diagnoses:  Active Problems:   Tachycardia   Discharged Condition: fair  Hospital Course: Seen and examined, transfer to [facility] due to condition followed there by MFM and Cardiology in a high risk pregnancy situation.  Consults: None  EKG reviewd by hospitalist MD here for clearance for transfer  Significant Diagnostic Studies: Pt seen at 32 weeks w known SVT; sx's of CP and Tachycardia and left hand numbness. FHT normal.  No Ctxs.  Discharge Exam: Blood pressure 137/75, pulse 113, resp. rate 20, last menstrual period 04/04/2015, SpO2 97 %. General appearance: alert, cooperative and no distress Cardio: regular rate and rhythm and rapid rate GI: soft, non-tender; bowel sounds normal; no masses,  no organomegaly, gravid, FHT 150s Extremities: extremities normal, atraumatic, no cyanosis or edema  Disposition: [facility] Care [facility] Not Defined     Medication List    TAKE these medications        acetaminophen 500 MG tablet  Commonly known as:  TYLENOL  Take 1,000 mg by mouth every 6 (six) hours as needed.     metoprolol succinate 25 MG 24 hr tablet  Commonly known as:  TOPROL XL  Take 2 tablets (50 mg total) by mouth daily.     multivitamin-prenatal 27-0.8 MG Tabs tablet  Take 1 tablet by mouth daily at 12 noon.     promethazine 12.5 MG tablet  Commonly known as:  PHENERGAN  Take 12.5 mg by mouth every 6 (six) hours as needed for nausea or vomiting.         Signed: Letitia Libra 12/13/2015, 5:20 PM

## 2015-12-13 NOTE — OB Triage Note (Signed)
EKG completed, Dr. Tiburcio Pea on unit

## 2015-12-13 NOTE — OB Triage Note (Signed)
Pt transported to [facility] by [facility].

## 2016-06-15 ENCOUNTER — Encounter (HOSPITAL_COMMUNITY): Payer: Self-pay

## 2017-04-27 IMAGING — US US SOFT TISSUE HEAD/NECK
1 series · 9 of 9 positions shown · non-contrast
Comparison: None.

CLINICAL DATA: Right sided tender nodule, palpable x4 days.

EXAM:
ULTRASOUND OF HEAD/NECK SOFT TISSUES
TECHNIQUE: Ultrasound examination of the head and neck soft tissues was
performed in the area of clinical concern.

[Series 1: us soft tissue head/neck · 0.06mm/px · 9 of 9 slices shown]
[im 1/9]
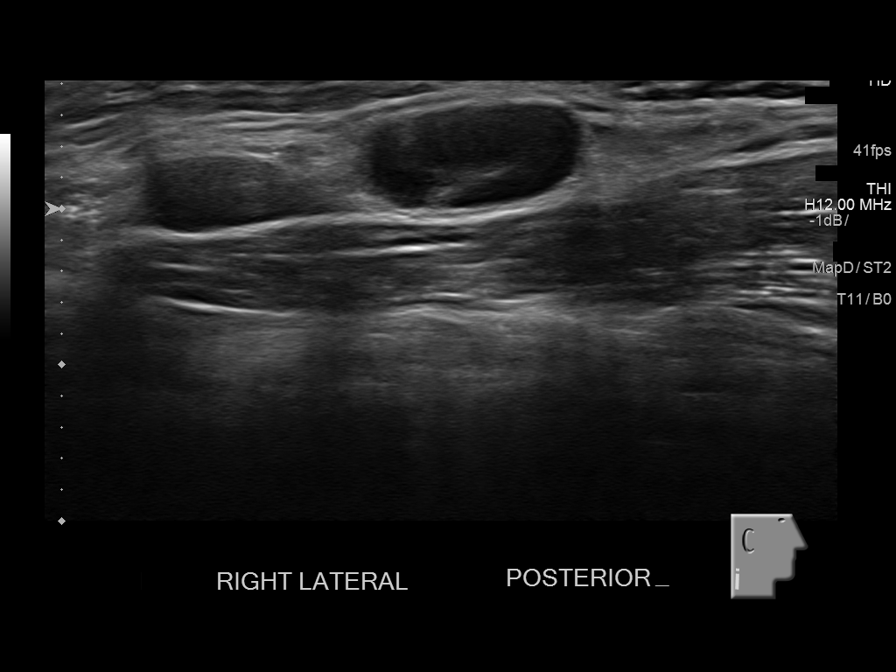
[im 2/9]
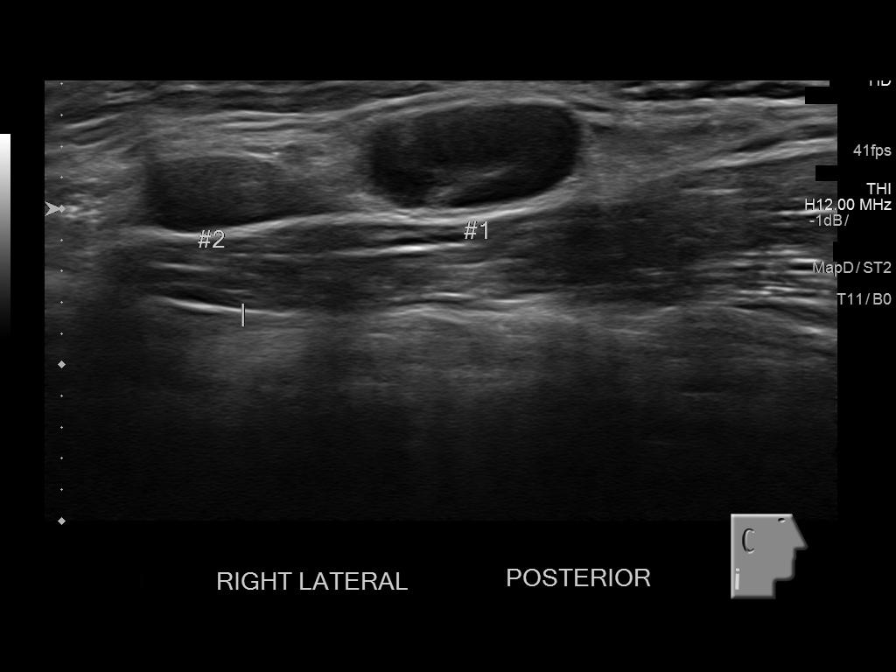
[im 3/9]
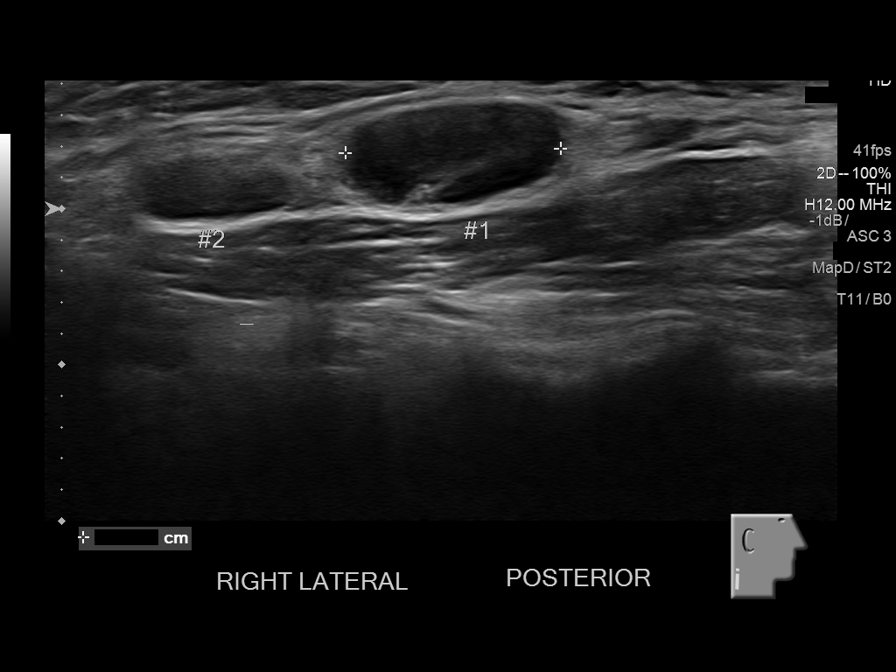
[im 4/9]
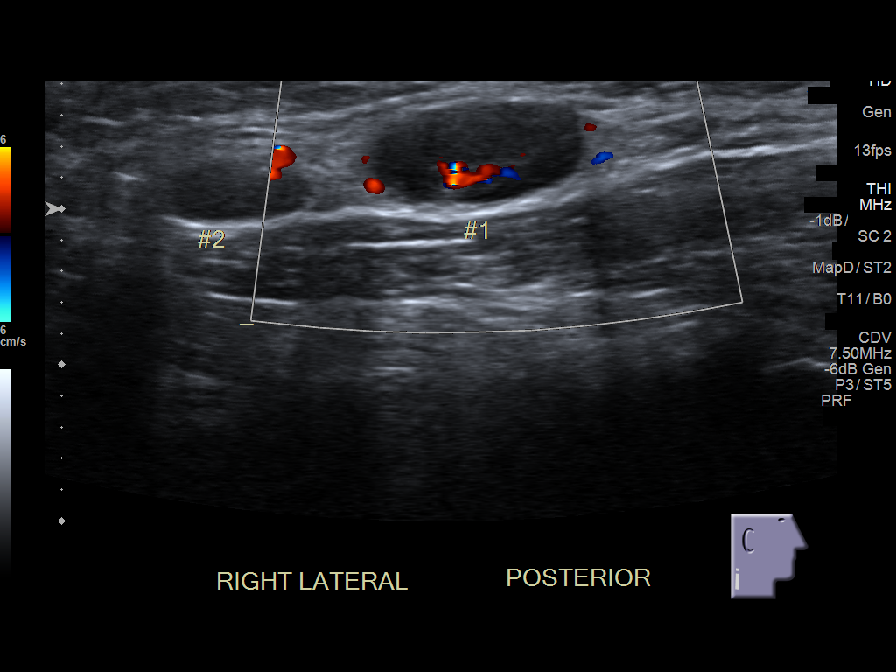
[im 5/9]
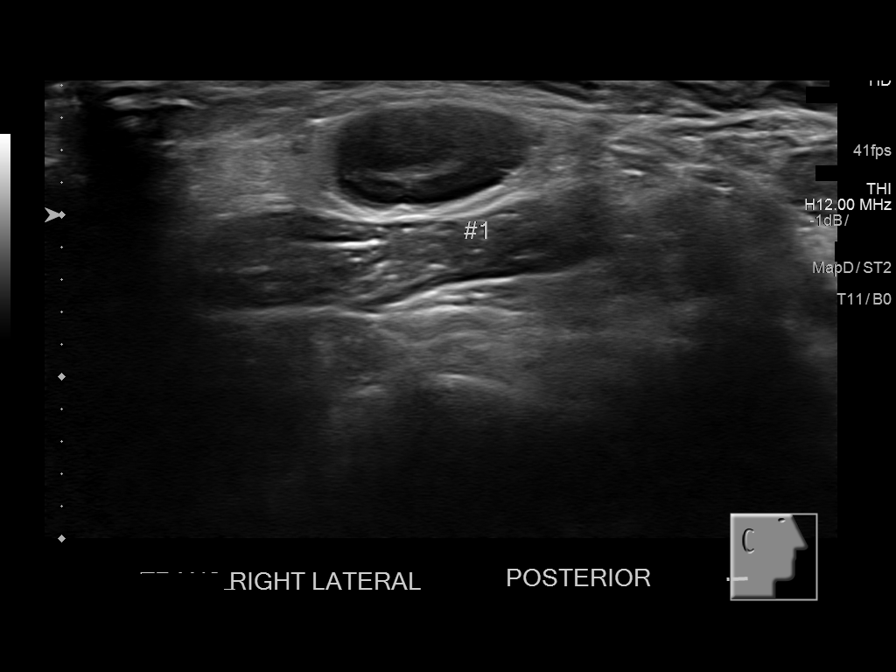
[im 6/9]
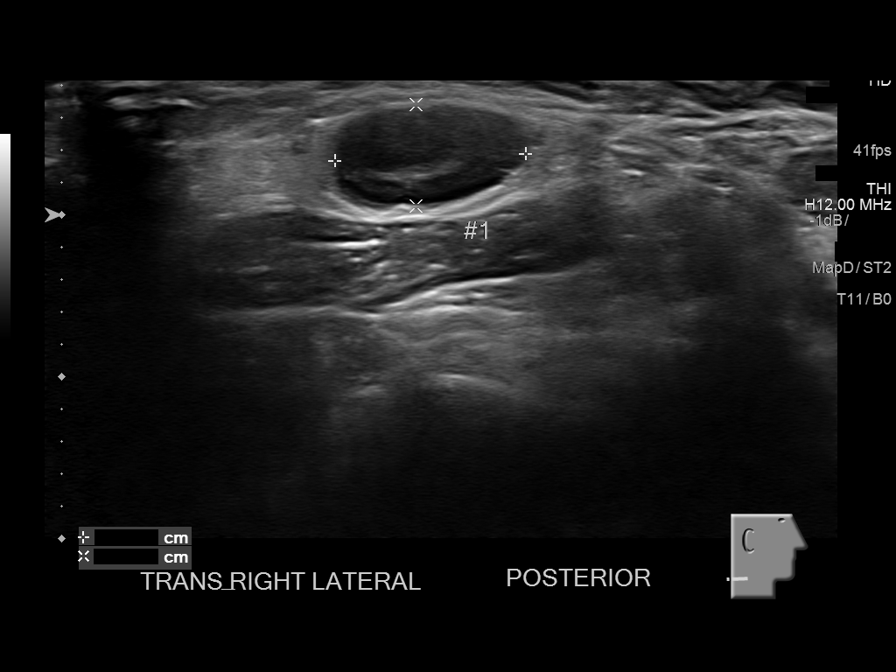
[im 7/9]
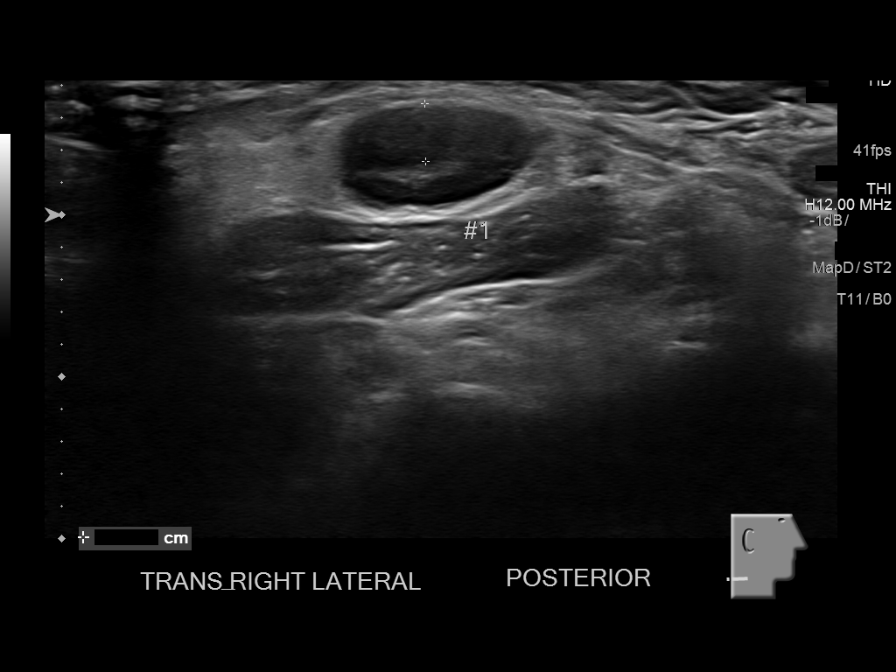
[im 8/9]
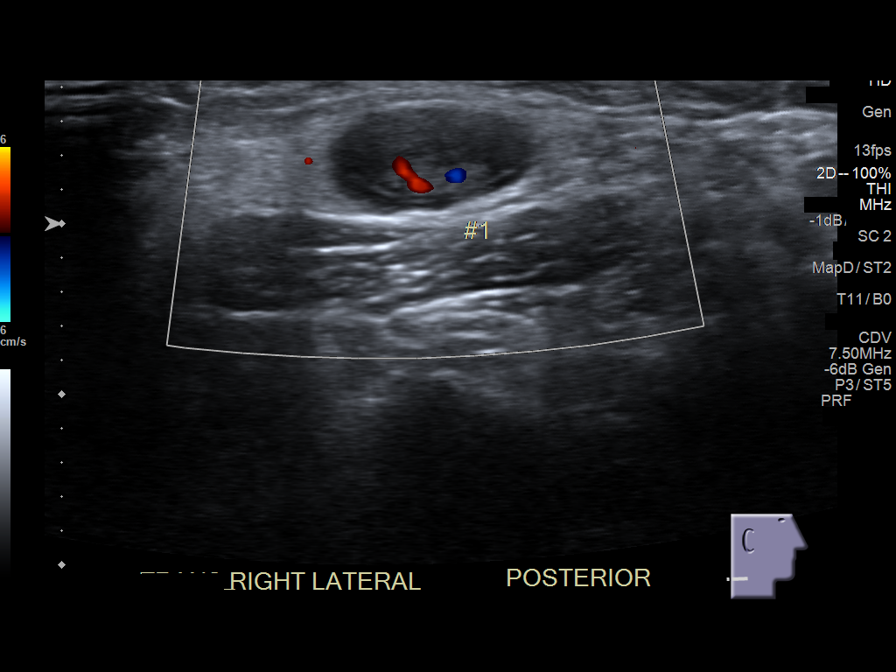
[im 9/9]
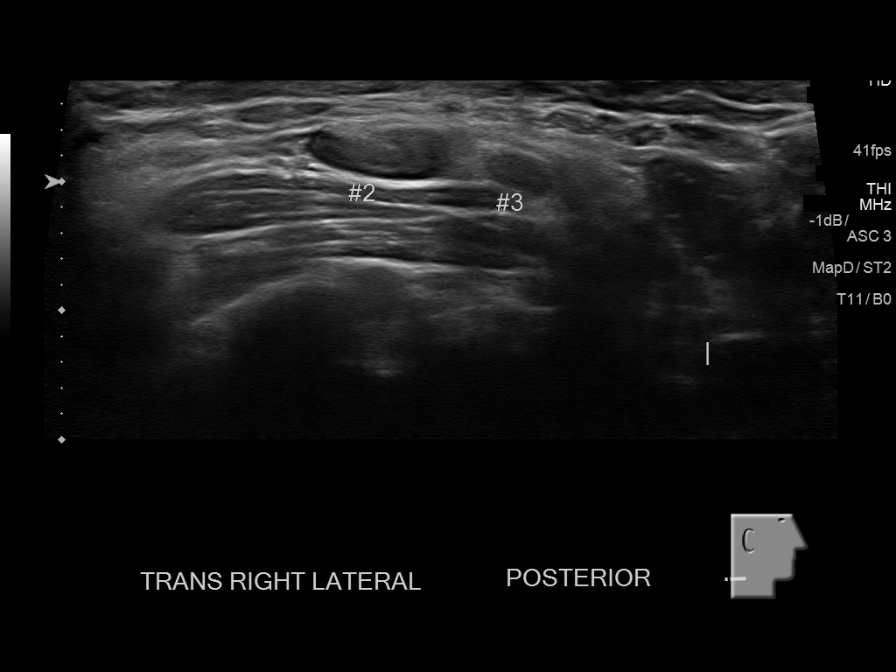

[9 of 9 positions shown; findings below may reference images not displayed]

FINDINGS: Several right lateral cervical lymph nodes are identified in the
region of concern, largest posterior auricular 14 x 12 x 6 mm. All
retain the usual hypoechoic appearance with central fatty hilum and
low level flow signal. No separate mass, cyst, abscess, or other
focal lesion.
IMPRESSION: 1. Borderline enlarged right cervical lymph node, possibly reactive
but nonspecific.

## 2017-06-19 ENCOUNTER — Encounter: Payer: Self-pay | Admitting: *Deleted

## 2017-06-19 ENCOUNTER — Emergency Department (INDEPENDENT_AMBULATORY_CARE_PROVIDER_SITE_OTHER): Payer: Commercial Managed Care - PPO

## 2017-06-19 ENCOUNTER — Emergency Department (INDEPENDENT_AMBULATORY_CARE_PROVIDER_SITE_OTHER)
Admission: EM | Admit: 2017-06-19 | Discharge: 2017-06-19 | Disposition: A | Discharge status: Home / Self Care | Payer: Commercial Managed Care - PPO | Source: Home / Self Care | Attending: Family Medicine | Admitting: Family Medicine

## 2017-06-19 ENCOUNTER — Encounter: Payer: Self-pay | Admitting: Family Medicine

## 2017-06-19 ENCOUNTER — Other Ambulatory Visit: Payer: Self-pay

## 2017-06-19 DIAGNOSIS — M79662 Pain in left lower leg: Secondary | ICD-10-CM | POA: Diagnosis not present

## 2017-06-19 NOTE — Discharge Instructions (Signed)
  You may take 500mg acetaminophen every 4-6 hours or in combination with ibuprofen 400-600mg every 6-8 hours as needed for pain and inflammation.  

## 2017-06-19 NOTE — ED Triage Notes (Signed)
Pt c/o LT calf pain x 1000 today after hearing a "snap" while exercising. She reports a hx of bone graft in that leg in 2000.

## 2017-06-19 NOTE — ED Provider Notes (Signed)
[facility]    CSN: [id number] Arrival date & time: 06/19/17  1229     History   Chief Complaint Chief Complaint  Patient presents with  . Leg Pain    HPI Alison Myers is a 32 y.o. female.   HPI Alison Myers is a 31 y.o. female presenting to UC with c/o sudden onset Left lower leg pain around her calf that started around 10AM while performing toe touches on a step during a "boot camp" class at her gym.  Pain is 5/10 at this time, worse with ambulation.  She reports hearing a load "pop" at the time of pain.  No pain medication taken PTA.  She does report having a bone graft in her tibia in 2000 due to a cyst.  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    Past Medical History:  Diagnosis Date  . Atrial tachycardia (HCC)   . Atrial tachycardia (HCC) 32 years old    Patient Active Problem List   Diagnosis Date Noted  . Tachycardia 12/13/2015  . Current use of beta blocker 09/08/2015  . pregnancy 08/11/2015  . H/O sinus tachycardia 08/11/2015  . daughter with Hyper IgE syndrome 08/11/2015  . Family history of myocardial infarction at age less than 54 08/11/2015  . Family history of congenital heart defect 08/11/2015    Past Surgical History:  Procedure Laterality Date  . ATRIAL ABLATION SURGERY  2009/2012  . BONE GRAFT HIP ILIAC CREST Left 2000    OB History    Gravida Para Term Preterm AB Living   2 1 1     1    SAB TAB Ectopic Multiple Live Births           1       Home Medications    Prior to Admission medications   Medication Sig Start Date End Date Taking? Authorizing Provider  levonorgestrel (MIRENA) 20 MCG/24HR IUD 1 each by Intrauterine route once.   Yes [provider]    Family History History reviewed. No pertinent family history.  Social History Social History   Tobacco Use  . Smoking status: Never Smoker  .  Smokeless tobacco: Never Used  Substance Use Topics  . Alcohol use: Yes  . Drug use: No     Allergies   Demerol [meperidine]   Review of Systems Review of Systems  Musculoskeletal: Positive for myalgias. Negative for arthralgias and joint swelling.  Skin: Negative for color change and wound.  Neurological: Negative for weakness and numbness.     Physical Exam Triage Vital Signs ED Triage Vitals [06/19/17 1258]  Enc Vitals Group     BP 137/85     Pulse Rate 90     Resp 16     Temp 98.6 F (37 C)     Temp Source Oral     SpO2 99 %     Weight 193 lb (87.5 kg)     Height 5\' 2"  (1.575 m)     Head Circumference      Peak Flow      Pain Score 5     Pain Loc      Pain Edu?      Excl. in GC?    No data found.  Updated Vital Signs BP 137/85 (BP Location: Left Arm)   Pulse 90   Temp 98.6 F (  37 C) (Oral)   Resp 16   Ht 5\' 2"  (1.575 m)   Wt 193 lb (87.5 kg)   SpO2 99%   BMI 35.30 kg/m   Visual Acuity Right Eye Distance:   Left Eye Distance:   Bilateral Distance:    Right Eye Near:   Left Eye Near:    Bilateral Near:     Physical Exam  Constitutional: She is oriented to person, place, and time. She appears well-developed and well-nourished. No distress.  HENT:  Head: Normocephalic and atraumatic.  Eyes: EOM are normal.  Neck: Normal range of motion.  Cardiovascular: Normal rate.  Pulses:      Dorsalis pedis pulses are 2+ on the left side.       Posterior tibial pulses are 2+ on the left side.  Pulmonary/Chest: Effort normal.  Musculoskeletal: She exhibits tenderness. She exhibits no edema.  Left leg: full ROM knee. Slight decreased dorsiflexion due to pain. Tenderness to calf over origin of achilles tendon. No deformity palpated or visualized.  Full ROM all toes. No tenderness to ankle or foot.   Neurological: She is alert and oriented to person, place, and time.  Skin: Skin is warm and dry. Capillary refill takes less than 2 seconds. She is not  diaphoretic.  Left lower leg: skin in tact. No ecchymosis or erythema.   Psychiatric: She has a normal mood and affect. Her behavior is normal.  Nursing note and vitals reviewed.    UC Treatments / Results  Labs (all labs ordered are listed, but only abnormal results are displayed) Labs Reviewed - No data to display  EKG  EKG Interpretation None       Radiology Dg Tibia/fibula Left  Result Date: 06/19/2017 CLINICAL DATA:  Left lower leg pain EXAM: LEFT TIBIA AND FIBULA - 2 VIEW COMPARISON:  12/10/2012 FINDINGS: Negative for fracture Lesion in the distal tibia unchanged. This involves the medial cortex and medullary space of the bone. There is cortical thickening without destruction or periosteal reaction. There are benign sclerotic changes in the medullary cavity. Probable fibrous cortical defect IMPRESSION: No acute abnormality Benign-appearing lesion distal tibia stable since 2014 Electronically Signed   By: Marlan Palau M.D.   On: 06/19/2017 13:20    Procedures Procedures (including critical care time)  Medications Ordered in UC Medications - No data to display   Initial Impression / Assessment and Plan / UC Course  I have reviewed the triage vital signs and the nursing notes.  Pertinent labs & imaging results that were available during my care of the patient were reviewed by me and considered in my medical decision making (see chart for details).     Plain films: no acute bony injury Consulted with Dr. Denyse Amass, Sports Medicine, who also examined pt with U/S. No evidence of achilles rupture at this time.   Will tx with ace wrap and tall walking boot. Encouraged rest, ice, compression and elevation Recommend f/u with Dr. Denyse Amass next week for recheck of symptoms Pt declined prescription pain medication.  May take acetaminophen or ibuprofen as needed.   Final Clinical Impressions(s) / UC Diagnoses   Final diagnoses:  Pain in left lower leg  Pain of left calf    ED  Discharge Orders    None       Controlled Substance Prescriptions [place] Controlled Substance Registry consulted? Not Applicable   Rolla Plate 06/19/17 1357

## 2017-06-19 NOTE — Progress Notes (Signed)
Limited musculoskeletal ultrasound of the left ankle: The Achilles tendon is intact from the insertion to the calcaneus to the origin at the gastrocnemius There is hypoechoic change deep to the Achilles tendon near the insertion of the gastrocnemius with no large obvious soleus tear. The plantaris tendon portion is not well-visualized distal at the Achilles tendon. Normal bony structures  Impression: Soleus tear versus plantaris tendon rupture/.

## 2017-06-26 ENCOUNTER — Encounter: Payer: Self-pay | Admitting: Family Medicine

## 2017-06-26 ENCOUNTER — Ambulatory Visit: Payer: Commercial Managed Care - PPO | Admitting: Family Medicine

## 2017-06-26 VITALS — BP 124/85 | HR 85 | Ht 63.0 in | Wt 193.0 lb

## 2017-06-26 DIAGNOSIS — S86812A Strain of other muscle(s) and tendon(s) at lower leg level, left leg, initial encounter: Secondary | ICD-10-CM

## 2017-06-26 NOTE — Patient Instructions (Addendum)
Thank you for coming in today. Attend PT.  Use Body helix full calf sleeve.  Recheck in 3 weeks.  Return sooner if not better.  Advance activity as tolerated.    Medial Head Gastrocnemius Tear Rehab Ask your health care provider which exercises are safe for you. Do exercises exactly as told by your health care provider and adjust them as directed. It is normal to feel mild stretching, pulling, tightness, or discomfort as you do these exercises, but you should stop right away if you feel sudden pain or your pain gets worse.Do not begin these exercises until told by your health care provider. Stretching and range of motion exercises These exercises warm up your muscles and joints and improve the movement and flexibility of your lower leg. These exercises also help to relieve pain and stiffness. Exercise A: Gastrocnemius stretch  1. Sit with your left / right leg extended. 2. Loop a belt or towel around the ball of your left / right foot. The ball of your foot is on the walking surface, right under your toes. 3. Hold both ends of the belt or towel. 4. Keep your left / right ankle and foot relaxed and keep your knee straight while you use the belt or towel to pull your foot and ankle toward you. Stop at the first point of resistance. 5. Hold this position for __________ seconds. Repeat __________ times. Complete this exercise __________ times a day. Exercise B: Ankle alphabet  1. Sit with your left / right leg supported at the lower leg. ? Do not rest your foot on anything. ? Make sure your foot has room to move freely. 2. Think of your left / right foot as a paintbrush, and move your foot to trace each letter of the alphabet in the air. Keep your hip and knee still while you trace. 3. Trace every letter from A to Z. Repeat __________ times. Complete this exercise __________ times a day. Strengthening exercises These exercises build strength and endurance in your lower leg. Endurance is the  ability to use your muscles for a long time, even after they get tired. Exercise C: Plantar flexors with band  1. Sit with your left / right leg extended. 2. Loop a rubber exercise band or tube around the ball of your left / right foot. The ball of your foot is on the walking surface, right under your toes. 3. While holding both ends of the band or tube, slowly point your toes downward, pushing them away from you. 4. Hold this position for __________ seconds. 5. Slowly return your foot to the starting position. Repeat __________ times. Complete this exercise __________ times a day. Exercise D: Plantar flexors, standing  1. Stand with your feet shoulder-width apart. 2. Place your hands on a wall or table to steady yourself as needed, but try not to use it very much for support. 3. Rise up on your toes. 4. If this exercise is too easy, try these options: ? Shift your weight toward your left / right leg until you feel challenged. ? If told by your health care provider, stand on your left / right foot only. 5. Hold this position for __________ seconds. Repeat __________ times. Complete this exercise __________ times a day. Exercise E: Plantar flexors, eccentric  1. Stand on the balls of your feet on the edge of a step. The ball of your foot is on the walking surface, right under your toes. 2. Place your hands on a wall or  railing for balance as needed, but try not to lean on it for support. 3. Rise up on your toes, using both legs to help. 4. Slowly shift all of your weight to your left / right foot and lift your other foot off the step. 5. Slowly lower your left / right heel so it drops below the level of the step. You will feel a slight stretch in your left / right calf. 6. Put your other foot back onto the step. Repeat __________ times. Complete this exercise __________ times a day. This information is not intended to replace advice given to you by your health care provider. Make sure you  discuss any questions you have with your health care provider. Document Released: 06/04/2005 Document Revised: 02/09/2016 Document Reviewed: 05/17/2015 Elsevier Interactive Patient Education  [employer].

## 2017-06-26 NOTE — Progress Notes (Signed)
   Subjective:    I'm seeing this patient as a consultation for:  Waylan Rocher PA-C  CC: Calf injury.  HPI: Ms Baune injured her left calf exercising last week.  She was seen in urgent care on January 2 where an ultrasound was performed not showing an Achilles tendon rupture.  She was thought to have a calf strain and treated with compression and a Cam walker boot.  In the interim she notes that she has considerably improved but continues to experience pain and discomfort in the mid calf especially with plantar flexion.  She is eager to resume exercise if possible.  Past medical history, Surgical history, Family history not pertinant except as noted below, Social history, Allergies, and medications have been entered into the medical record, reviewed, and no changes needed.   Review of Systems: No headache, visual changes, nausea, vomiting, diarrhea, constipation, dizziness, abdominal pain, skin rash, fevers, chills, night sweats, weight loss, swollen lymph nodes, body aches, joint swelling, muscle aches, chest pain, shortness of breath, mood changes, visual or auditory hallucinations.   Objective:    Vitals:   06/26/17 1116  BP: 124/85  Pulse: 85   General: Well Developed, well nourished, and in no acute distress.  Neuro/Psych: Alert and oriented x3, extra-ocular muscles intact, able to move all 4 extremities, sensation grossly intact. Skin: Warm and dry, no rashes noted.  Respiratory: Not using accessory muscles, speaking in full sentences, trachea midline.  Cardiovascular: Pulses palpable, no extremity edema. Abdomen: Does not appear distended. MSK:  Left leg normal-appearing no swelling or ecchymosis. No palpable defects along the Achilles tendon. Tender to palpation near the insertion of the Achilles tendon onto the gastrocnemius.  No masses palpated. Ankle motion is normal but painful with plantar flexion. Pulses capillary refill and sensation are intact.  Imaging musculoskeletal  ultrasound of the left reveals an intact Achilles tendon with no obvious large defects of the gastrocnemius or soleus.  Normal bony structures present.  No results found for this or any previous visit (from the past 24 hour(s)). No results found.  Impression and Recommendations:    Assessment and Plan: 32 y.o. female with calf strain or possible plantaris rupture.  She is doing well and I think safe to resume exercise.  Plan to wean out of Cam walker boot when able and refer to physical therapy.  Recommend a calf sleeve and recheck in a few weeks..   Orders Placed This Encounter  Procedures  . Ambulatory referral to Physical Therapy    Referral Priority:   Routine    Referral Type:   Physical Medicine    Referral Reason:   Specialty Services Required    Requested Specialty:   Physical Therapy   No orders of the defined types were placed in this encounter.   Discussed warning signs or symptoms. Please see discharge instructions. Patient expresses understanding.

## 2017-07-03 ENCOUNTER — Ambulatory Visit: Payer: Commercial Managed Care - PPO | Admitting: Rehabilitative and Restorative Service Providers"

## 2017-07-19 ENCOUNTER — Ambulatory Visit: Payer: Commercial Managed Care - PPO | Admitting: Family Medicine

## 2017-07-19 DIAGNOSIS — Z0189 Encounter for other specified special examinations: Secondary | ICD-10-CM

## 2017-08-22 ENCOUNTER — Encounter (INDEPENDENT_AMBULATORY_CARE_PROVIDER_SITE_OTHER): Payer: Self-pay

## 2017-09-02 ENCOUNTER — Ambulatory Visit (INDEPENDENT_AMBULATORY_CARE_PROVIDER_SITE_OTHER): Payer: Self-pay | Admitting: Family Medicine

## 2017-09-03 ENCOUNTER — Encounter (INDEPENDENT_AMBULATORY_CARE_PROVIDER_SITE_OTHER): Payer: Self-pay | Admitting: Family Medicine

## 2017-09-03 ENCOUNTER — Ambulatory Visit (INDEPENDENT_AMBULATORY_CARE_PROVIDER_SITE_OTHER): Payer: Commercial Managed Care - PPO | Admitting: Family Medicine

## 2017-09-03 VITALS — BP 118/76 | HR 78 | Temp 97.9°F | Ht 61.0 in | Wt 182.0 lb

## 2017-09-03 DIAGNOSIS — E669 Obesity, unspecified: Secondary | ICD-10-CM

## 2017-09-03 DIAGNOSIS — I471 Supraventricular tachycardia: Secondary | ICD-10-CM

## 2017-09-03 DIAGNOSIS — Z9189 Other specified personal risk factors, not elsewhere classified: Secondary | ICD-10-CM

## 2017-09-03 DIAGNOSIS — Z1331 Encounter for screening for depression: Secondary | ICD-10-CM

## 2017-09-03 DIAGNOSIS — R5383 Other fatigue: Secondary | ICD-10-CM | POA: Diagnosis not present

## 2017-09-03 DIAGNOSIS — R0602 Shortness of breath: Secondary | ICD-10-CM | POA: Insufficient documentation

## 2017-09-03 DIAGNOSIS — Z0289 Encounter for other administrative examinations: Secondary | ICD-10-CM

## 2017-09-03 DIAGNOSIS — Z6834 Body mass index (BMI) 34.0-34.9, adult: Secondary | ICD-10-CM

## 2017-09-04 LAB — CBC WITH DIFFERENTIAL
BASOS ABS: 0 10*3/uL (ref 0.0–0.2)
BASOS: 0 %
EOS (ABSOLUTE): 0.1 10*3/uL (ref 0.0–0.4)
Eos: 1 %
Hematocrit: 42.3 % (ref 34.0–46.6)
Hemoglobin: 14.4 g/dL (ref 11.1–15.9)
Immature Grans (Abs): 0 10*3/uL (ref 0.0–0.1)
Immature Granulocytes: 0 %
LYMPHS: 24 %
Lymphocytes Absolute: 1.8 10*3/uL (ref 0.7–3.1)
MCH: 28.6 pg (ref 26.6–33.0)
MCHC: 34 g/dL (ref 31.5–35.7)
MCV: 84 fL (ref 79–97)
MONOCYTES: 8 %
Monocytes Absolute: 0.6 10*3/uL (ref 0.1–0.9)
NEUTROS PCT: 67 %
Neutrophils Absolute: 5.2 10*3/uL (ref 1.4–7.0)
RBC: 5.04 x10E6/uL (ref 3.77–5.28)
RDW: 13 % (ref 12.3–15.4)
WBC: 7.8 10*3/uL (ref 3.4–10.8)

## 2017-09-04 LAB — T3: T3 TOTAL: 131 ng/dL (ref 71–180)

## 2017-09-04 LAB — COMPREHENSIVE METABOLIC PANEL
ALT: 16 IU/L (ref 0–32)
AST: 16 IU/L (ref 0–40)
Albumin/Globulin Ratio: 1.7 (ref 1.2–2.2)
Albumin: 4.7 g/dL (ref 3.5–5.5)
Alkaline Phosphatase: 80 IU/L (ref 39–117)
BUN/Creatinine Ratio: 16 (ref 9–23)
BUN: 11 mg/dL (ref 6–20)
Bilirubin Total: 0.4 mg/dL (ref 0.0–1.2)
CALCIUM: 9.5 mg/dL (ref 8.7–10.2)
CO2: 19 mmol/L — ABNORMAL LOW (ref 20–29)
CREATININE: 0.68 mg/dL (ref 0.57–1.00)
Chloride: 105 mmol/L (ref 96–106)
GFR, EST AFRICAN AMERICAN: 137 mL/min/{1.73_m2} (ref >59)
GFR, EST NON AFRICAN AMERICAN: 119 mL/min/{1.73_m2} (ref >59)
GLUCOSE: 80 mg/dL (ref 65–99)
Globulin, Total: 2.7 g/dL (ref 1.5–4.5)
Potassium: 4.4 mmol/L (ref 3.5–5.2)
Sodium: 142 mmol/L (ref 134–144)
Total Protein: 7.4 g/dL (ref 6.0–8.5)

## 2017-09-04 LAB — HEMOGLOBIN A1C
ESTIMATED AVERAGE GLUCOSE: 105 mg/dL
Hgb A1c MFr Bld: 5.3 % (ref 4.8–5.6)

## 2017-09-04 LAB — LIPID PANEL WITH LDL/HDL RATIO
CHOLESTEROL TOTAL: 141 mg/dL (ref 100–199)
HDL: 49 mg/dL (ref >39)
LDL Calculated: 80 mg/dL (ref 0–99)
LDl/HDL Ratio: 1.6 ratio (ref 0.0–3.2)
Triglycerides: 62 mg/dL (ref 0–149)
VLDL CHOLESTEROL CAL: 12 mg/dL (ref 5–40)

## 2017-09-04 LAB — TSH: TSH: 0.909 u[IU]/mL (ref 0.450–4.500)

## 2017-09-04 LAB — VITAMIN B12: Vitamin B-12: 355 pg/mL (ref 232–1245)

## 2017-09-04 LAB — T4, FREE: Free T4: 1.14 ng/dL (ref 0.82–1.77)

## 2017-09-04 LAB — INSULIN, RANDOM: INSULIN: 12.3 u[IU]/mL (ref 2.6–24.9)

## 2017-09-04 LAB — FOLATE: Folate: 14.5 ng/mL (ref >3.0)

## 2017-09-04 LAB — VITAMIN D 25 HYDROXY (VIT D DEFICIENCY, FRACTURES): VIT D 25 HYDROXY: 19.7 ng/mL — AB (ref 30.0–100.0)

## 2017-09-04 NOTE — Progress Notes (Signed)
Office: [phone]  /  Fax: [phone]   Dear Alison Frees, NP,   Thank you for referring Alison Myers to our clinic.The following note includes my evaluation and treatment recommendations.  HPI:   Chief Complaint: OBESITY    Alison Myers has been referred by Alison Frees, NP for consultation regarding her obesity and obesity related comorbidities.    Alison Myers (MR# [id number]) is a 32 y.o. female who presents on 09/04/2017 for obesity evaluation and treatment. Current BMI is Body mass index is 34.39 kg/m.Marland Kitchen Alison Myers has been struggling with her weight for many years and has been unsuccessful in either losing weight, maintaining weight loss, or reaching her healthy weight goal.     Alison Myers attended our information session and states she is currently in the action stage of change and ready to dedicate time achieving and maintaining a healthier weight. Alison Myers is interested in becoming our patient and working on intensive lifestyle modifications including (but not limited to) diet, exercise and weight loss.    Alison Myers states her family eats meals together she thinks her family will eat healthier with  her her desired weight loss is 58 lbs she has been heavy most of  her life she started gaining weight in 6th grade her heaviest weight ever was 189 lbs. she has significant food cravings issues  she snacks frequently in the evenings she is frequently drinking liquids with calories she frequently makes poor food choices she has problems with excessive hunger  she frequently eats larger portions than normal  she has binge eating behaviors she struggles with emotional eating    Fatigue Alison Myers feels her energy is lower than it should be. This has worsened with weight gain and has not worsened recently. Alison Myers admits to daytime somnolence and admits to waking up still tired. Patient is at risk for obstructive sleep apnea. Patent has a history of symptoms of daytime fatigue and morning fatigue. Patient  generally gets 8 hours of sleep per night, and states they generally have restful sleep. Snoring is present. Apneic episodes are not present. Epworth Sleepiness Score is 6  EKG was ordered today and was within normal limits (history of atrial tachycardia in pregnancy).  Dyspnea on exertion Alison Myers notes increasing shortness of breath with exercising and seems to be worsening over time with weight gain. She notes getting out of breath sooner with activity than she used to. This has not gotten worse recently. EKG was ordered today and was within normal limits (history of atrial tachycardia in pregnancy). Alison Myers denies orthopnea.  Atrial Tachycardia EKG done today. Hear rate was within normal limits. Alison Myers has a significant family history of heart disease.  At risk for cardiovascular disease Alison Myers is at a higher than average risk for cardiovascular disease due to obesity and significant family history of heart disease. She currently denies any chest pain.  Depression Screen Alison Myers's Food and Mood (modified PHQ-9) score was  Depression screen PHQ 2/9 09/03/2017  Decreased Interest 3  Down, Depressed, Hopeless 2  PHQ - 2 Score 5  Altered sleeping 1  Tired, decreased energy 3  Change in appetite 2  Feeling bad or failure about yourself  2  Trouble concentrating 1  Moving slowly or fidgety/restless 0  Suicidal thoughts 0  PHQ-9 Score 14  Difficult doing work/chores Somewhat difficult    ALLERGIES: Allergies  Allergen Reactions  . Demerol [Meperidine] Hives and Swelling    MEDICATIONS: Current Outpatient Medications on File Prior to Visit  Medication Sig Dispense Refill  .  levonorgestrel (MIRENA) 20 MCG/24HR IUD 1 each by Intrauterine route once.     No current facility-administered medications on file prior to visit.     PAST MEDICAL HISTORY: Past Medical History:  Diagnosis Date  . Atrial tachycardia (HCC)   . Atrial tachycardia (HCC) 32 years old    PAST SURGICAL  HISTORY: Past Surgical History:  Procedure Laterality Date  . ATRIAL ABLATION SURGERY  2009/2012  . BONE GRAFT HIP ILIAC CREST Left 2000    SOCIAL HISTORY: Social History   Tobacco Use  . Smoking status: Never Smoker  . Smokeless tobacco: Never Used  Substance Use Topics  . Alcohol use: Yes  . Drug use: No    FAMILY HISTORY: Family History  Problem Relation Age of Onset  . Diabetes Father   . Hyperlipidemia Father   . Hypertension Father   . Heart disease Father   . Sudden death Father   . Sleep apnea Father   . Obesity Father   . Diabetes Mother   . Hypertension Mother   . Hyperlipidemia Mother   . Thyroid disease Mother   . Sleep apnea Mother   . Obesity Mother     ROS: Review of Systems  Constitutional: Positive for malaise/fatigue.  Respiratory: Positive for shortness of breath (on exertion).   Cardiovascular: Negative for chest pain and orthopnea.    PHYSICAL EXAM: Blood pressure 118/76, pulse 78, temperature 97.9 F (36.6 C), temperature source Oral, height 5\' 1"  (1.549 m), weight 182 lb (82.6 kg), SpO2 100 %, unknown if currently breastfeeding. Body mass index is 34.39 kg/m. Physical Exam  Constitutional: She is oriented to person, place, and time. She appears well-developed and well-nourished.  HENT:  Head: Normocephalic and atraumatic.  Nose: Nose normal.  Eyes: EOM are normal. No scleral icterus.  Neck: Normal range of motion. Neck supple. No thyromegaly present.  Cardiovascular: Normal rate and regular rhythm.  Pulmonary/Chest: Effort normal. No respiratory distress.  Abdominal: Soft. There is no tenderness.  +obesity  Musculoskeletal: Normal range of motion.  Range of Motion normal in all 4 extremities  Neurological: She is alert and oriented to person, place, and time. Coordination normal.  Skin: Skin is warm and dry.  Psychiatric: She has a normal mood and affect. Her behavior is normal.  Vitals reviewed.   RECENT LABS AND  TESTS: BMET    Component Value Date/Time   NA 142 09/03/2017 1216   NA 143 07/25/2011 0454   K 4.4 09/03/2017 1216   K 3.7 07/25/2011 0454   CL 105 09/03/2017 1216   CL 108 (H) 07/25/2011 0454   CO2 19 (L) 09/03/2017 1216   CO2 23 07/25/2011 0454   GLUCOSE 80 09/03/2017 1216   GLUCOSE 87 08/04/2015 1212   GLUCOSE 110 (H) 07/25/2011 0454   BUN 11 09/03/2017 1216   BUN 10 07/25/2011 0454   CREATININE 0.68 09/03/2017 1216   CREATININE 0.73 07/25/2011 0454   CALCIUM 9.5 09/03/2017 1216   CALCIUM 8.7 07/25/2011 0454   GFRNONAA 119 09/03/2017 1216   GFRNONAA >60 07/25/2011 0454   GFRAA 137 09/03/2017 1216   GFRAA >60 07/25/2011 0454   Lab Results  Component Value Date   HGBA1C 5.3 09/03/2017   Lab Results  Component Value Date   INSULIN 12.3 09/03/2017   CBC    Component Value Date/Time   WBC 7.8 09/03/2017 1216   WBC 9.0 08/04/2015 1212   RBC 5.04 09/03/2017 1216   RBC 4.34 08/04/2015 1212   HGB 14.4  09/03/2017 1216   HCT 42.3 09/03/2017 1216   PLT 214 08/04/2015 1212   PLT 175 07/25/2011 0454   MCV 84 09/03/2017 1216   MCV 86 07/25/2011 0454   MCH 28.6 09/03/2017 1216   MCH 28.4 08/04/2015 1212   MCHC 34.0 09/03/2017 1216   MCHC 34.7 08/04/2015 1212   RDW 13.0 09/03/2017 1216   RDW 12.6 07/25/2011 0454   LYMPHSABS 1.8 09/03/2017 1216   EOSABS 0.1 09/03/2017 1216   BASOSABS 0.0 09/03/2017 1216   Iron/TIBC/Ferritin/ %Sat No results found for: IRON, TIBC, FERRITIN, IRONPCTSAT Lipid Panel     Component Value Date/Time   CHOL 141 09/03/2017 1216   TRIG 62 09/03/2017 1216   HDL 49 09/03/2017 1216   LDLCALC 80 09/03/2017 1216   Hepatic Function Panel     Component Value Date/Time   PROT 7.4 09/03/2017 1216   PROT 7.4 07/25/2011 0454   ALBUMIN 4.7 09/03/2017 1216   ALBUMIN 4.2 07/25/2011 0454   AST 16 09/03/2017 1216   AST 17 07/25/2011 0454   ALT 16 09/03/2017 1216   ALT 18 07/25/2011 0454   ALKPHOS 80 09/03/2017 1216   ALKPHOS 60 07/25/2011 0454    BILITOT 0.4 09/03/2017 1216   BILITOT 0.4 07/25/2011 0454      Component Value Date/Time   TSH WILL FOLLOW 09/03/2017 1216    ECG  shows NSR with a rate of 88 BPM INDIRECT CALORIMETER done today shows a VO2 of 203 and a REE of 1415.  Her calculated basal metabolic rate is 1610 thus her basal metabolic rate is worse than expected.    ASSESSMENT AND PLAN: Other fatigue - Plan: EKG 12-Lead, Vitamin B12, CBC With Differential, Comprehensive metabolic panel, Folate, Hemoglobin A1c, Lipid Panel With LDL/HDL Ratio, Insulin, random, T3, T4, free, TSH, VITAMIN D 25 Hydroxy (Vit-D Deficiency, Fractures)  Shortness of breath on exertion - Plan: CBC With Differential  Atrial tachycardia (HCC) - Plan: Lipid Panel With LDL/HDL Ratio  Depression screening  At risk for heart disease  Class 1 obesity with serious comorbidity and body mass index (BMI) of 34.0 to 34.9 in adult, unspecified obesity type  PLAN: Fatigue Alison Myers was informed that her fatigue may be related to obesity, depression or many other causes. Labs will be ordered, and in the meanwhile Alison Myers has agreed to work on diet, exercise and weight loss to help with fatigue. Proper sleep hygiene was discussed including the need for 7-8 hours of quality sleep each night. A sleep study was not ordered based on symptoms and Epworth score. We will order indirect calorimetry.  Dyspnea on exertion Alison Myers's shortness of breath appears to be obesity related and exercise induced. She has agreed to work on weight loss and gradually increase exercise to treat her exercise induced shortness of breath. If Alison Myers follows our instructions and loses weight without improvement of her shortness of breath, we will plan to refer to pulmonology. We will order indirect calorimetry and labs and will monitor this condition regularly. Alison Myers agrees to this plan.  Atrial Tachycardia We will continue to monitor this condition and Alison Myers agrees to follow up with our clinic  in 2 weeks.  Cardiovascular risk counseling Alison Myers was given extended (15 minutes) coronary artery disease prevention counseling today. She is 32 y.o. female and has risk factors for heart disease including obesity and significant family history of heart disease.. We discussed intensive lifestyle modifications today with an emphasis on specific weight loss instructions and strategies. Pt was also informed of  the importance of increasing exercise and decreasing saturated fats to help prevent heart disease.  Depression Screen Alison Myers had a moderately positive depression screening. Depression is commonly associated with obesity and often results in emotional eating behaviors. We will monitor this closely and work on CBT to help improve the non-hunger eating patterns. Referral to Psychology may be required if no improvement is seen as she continues in our clinic.  Obesity Alison Myers is currently in the action stage of change and her goal is to continue with weight loss efforts. I recommend Alison Myers begin the structured treatment plan as follows:  She has agreed to follow the Category 2 plan Alison Myers has been instructed to eventually work up to a goal of 150 minutes of combined cardio and strengthening exercise per week for weight loss and overall health benefits. We discussed the following Behavioral Modification Strategies today: increasing lean protein intake and work on meal planning and easy cooking plans   She was informed of the importance of frequent follow up visits to maximize her success with intensive lifestyle modifications for her multiple health conditions. She was informed we would discuss her lab results at her next visit unless there is a critical issue that needs to be addressed sooner. Ocia agreed to keep her next visit at the agreed upon time to discuss these results.    OBESITY BEHAVIORAL INTERVENTION VISIT  Today's visit was # 1 out of 22.  Starting weight: 182 lbs Starting date:  09/03/17 Today's weight : 182 lbs Today's date: 09/03/2017 Total lbs lost to date: 0 (Patients must lose 7 lbs in the first 6 months to continue with counseling)   ASK: We discussed the diagnosis of obesity with Alison Myers today and Dorothe agreed to give [place] permission to discuss obesity behavioral modification therapy today.  ASSESS: Andreya has the diagnosis of obesity and her BMI today is 34.41 Aveya is in the action stage of change   ADVISE: Max was educated on the multiple health risks of obesity as well as the benefit of weight loss to improve her health. She was advised of the need for long term treatment and the importance of lifestyle modifications.  AGREE: Multiple dietary modification options and treatment options were discussed and  Jadesola agreed to the above obesity treatment plan.   I, [place] Crane, am acting as transcriptionist for  Filbert Schilder, MD   I have reviewed the above documentation for accuracy and completeness, and I agree with the above. - Debbra Riding, MD

## 2017-09-17 ENCOUNTER — Ambulatory Visit (INDEPENDENT_AMBULATORY_CARE_PROVIDER_SITE_OTHER): Payer: Commercial Managed Care - PPO | Admitting: Family Medicine

## 2017-09-17 VITALS — BP 116/74 | HR 86 | Temp 98.4°F | Ht 61.0 in | Wt 179.0 lb

## 2017-09-17 DIAGNOSIS — Z6833 Body mass index (BMI) 33.0-33.9, adult: Secondary | ICD-10-CM

## 2017-09-17 DIAGNOSIS — Z9189 Other specified personal risk factors, not elsewhere classified: Secondary | ICD-10-CM

## 2017-09-17 DIAGNOSIS — E8881 Metabolic syndrome: Secondary | ICD-10-CM

## 2017-09-17 DIAGNOSIS — E559 Vitamin D deficiency, unspecified: Secondary | ICD-10-CM

## 2017-09-17 DIAGNOSIS — E669 Obesity, unspecified: Secondary | ICD-10-CM

## 2017-09-17 MED ORDER — VITAMIN D (ERGOCALCIFEROL) 1.25 MG (50000 UNIT) PO CAPS: 50000.0000 [IU] | ORAL_CAPSULE | ORAL | 0 refills | Status: DC

## 2017-09-17 NOTE — Progress Notes (Signed)
Office: [phone]  /  Fax: [phone]   HPI:   Chief Complaint: OBESITY Alison Myers is here to discuss her progress with her obesity treatment plan. She is on the Category 2 plan and is following her eating plan approximately 95 % of the time. She states she is doing burn boot camp for 45 minutes 6 times per week. Alison Myers found the meal plan easy. She wrote down all aspects of the meal plan and crossed off everything as she ate. Her snacks consisted of laughing cow cheese and crackers, ice cream cups, 100 calorie cookie packs and 100 calorie granola bars. Her weight is 179 lb (81.2 kg) today and has had a weight loss of 3 pounds over a period of 2 weeks since her last visit. She has lost 3 lbs since starting treatment with [place].  Vitamin D deficiency Alison Myers has a diagnosis of vitamin D deficiency. She is not currently on vit D supplement. Alison Myers admits fatigue and denies nausea, vomiting or muscle weakness.  Insulin Resistance Alison Myers has a diagnosis of insulin resistance based on her elevated fasting insulin of 12.3. Although Alison Myers's blood glucose readings are still under good control, insulin resistance puts her at greater risk of metabolic syndrome and diabetes. Her last A1c was at 5.3 She is not taking metformin currently and continues to work on diet and exercise to decrease risk of diabetes.  At risk for diabetes Alison Myers is at higher than average risk for developing diabetes due to her obesity and insulin resistance. She currently denies polyuria or polydipsia.  ALLERGIES: Allergies  Allergen Reactions  . Demerol [Meperidine] Hives and Swelling    MEDICATIONS: Current Outpatient Medications on File Prior to Visit  Medication Sig Dispense Refill  . levonorgestrel (MIRENA) 20 MCG/24HR IUD 1 each by Intrauterine route once.     No current facility-administered medications on file prior to visit.     PAST MEDICAL HISTORY: Past Medical History:  Diagnosis Date  . Atrial tachycardia  (HCC)   . Atrial tachycardia (HCC) 32 years old    PAST SURGICAL HISTORY: Past Surgical History:  Procedure Laterality Date  . ATRIAL ABLATION SURGERY  2009/2012  . BONE GRAFT HIP ILIAC CREST Left 2000    SOCIAL HISTORY: Social History   Tobacco Use  . Smoking status: Never Smoker  . Smokeless tobacco: Never Used  Substance Use Topics  . Alcohol use: Yes  . Drug use: No    FAMILY HISTORY: Family History  Problem Relation Age of Onset  . Diabetes Father   . Hyperlipidemia Father   . Hypertension Father   . Heart disease Father   . Sudden death Father   . Sleep apnea Father   . Obesity Father   . Diabetes Mother   . Hypertension Mother   . Hyperlipidemia Mother   . Thyroid disease Mother   . Sleep apnea Mother   . Obesity Mother     ROS: Review of Systems  Constitutional: Positive for malaise/fatigue and weight loss.  Gastrointestinal: Negative for nausea and vomiting.  Genitourinary: Negative for frequency.  Musculoskeletal:       Negative for muscle weakness  Endo/Heme/Allergies: Negative for polydipsia.    PHYSICAL EXAM: Blood pressure 116/74, pulse 86, temperature 98.4 F (36.9 C), temperature source Oral, height 5\' 1"  (1.549 m), weight 179 lb (81.2 kg), SpO2 99 %, unknown if currently breastfeeding. Body mass index is 33.82 kg/m. Physical Exam  Constitutional: She is oriented to person, place, and time. She appears well-developed and well-nourished.  Cardiovascular: Normal rate.  Pulmonary/Chest: Effort normal.  Musculoskeletal: Normal range of motion.  Neurological: She is oriented to person, place, and time.  Skin: Skin is warm and dry.  Psychiatric: She has a normal mood and affect. Her behavior is normal.  Vitals reviewed.   RECENT LABS AND TESTS: BMET    Component Value Date/Time   NA 142 09/03/2017 1216   NA 143 07/25/2011 0454   K 4.4 09/03/2017 1216   K 3.7 07/25/2011 0454   CL 105 09/03/2017 1216   CL 108 (H) 07/25/2011 0454    CO2 19 (L) 09/03/2017 1216   CO2 23 07/25/2011 0454   GLUCOSE 80 09/03/2017 1216   GLUCOSE 87 08/04/2015 1212   GLUCOSE 110 (H) 07/25/2011 0454   BUN 11 09/03/2017 1216   BUN 10 07/25/2011 0454   CREATININE 0.68 09/03/2017 1216   CREATININE 0.73 07/25/2011 0454   CALCIUM 9.5 09/03/2017 1216   CALCIUM 8.7 07/25/2011 0454   GFRNONAA 119 09/03/2017 1216   GFRNONAA >60 07/25/2011 0454   GFRAA 137 09/03/2017 1216   GFRAA >60 07/25/2011 0454   Lab Results  Component Value Date   HGBA1C 5.3 09/03/2017   Lab Results  Component Value Date   INSULIN 12.3 09/03/2017   CBC    Component Value Date/Time   WBC 7.8 09/03/2017 1216   WBC 9.0 08/04/2015 1212   RBC 5.04 09/03/2017 1216   RBC 4.34 08/04/2015 1212   HGB 14.4 09/03/2017 1216   HCT 42.3 09/03/2017 1216   PLT 214 08/04/2015 1212   PLT 175 07/25/2011 0454   MCV 84 09/03/2017 1216   MCV 86 07/25/2011 0454   MCH 28.6 09/03/2017 1216   MCH 28.4 08/04/2015 1212   MCHC 34.0 09/03/2017 1216   MCHC 34.7 08/04/2015 1212   RDW 13.0 09/03/2017 1216   RDW 12.6 07/25/2011 0454   LYMPHSABS 1.8 09/03/2017 1216   EOSABS 0.1 09/03/2017 1216   BASOSABS 0.0 09/03/2017 1216   Iron/TIBC/Ferritin/ %Sat No results found for: IRON, TIBC, FERRITIN, IRONPCTSAT Lipid Panel     Component Value Date/Time   CHOL 141 09/03/2017 1216   TRIG 62 09/03/2017 1216   HDL 49 09/03/2017 1216   LDLCALC 80 09/03/2017 1216   Hepatic Function Panel     Component Value Date/Time   PROT 7.4 09/03/2017 1216   PROT 7.4 07/25/2011 0454   ALBUMIN 4.7 09/03/2017 1216   ALBUMIN 4.2 07/25/2011 0454   AST 16 09/03/2017 1216   AST 17 07/25/2011 0454   ALT 16 09/03/2017 1216   ALT 18 07/25/2011 0454   ALKPHOS 80 09/03/2017 1216   ALKPHOS 60 07/25/2011 0454   BILITOT 0.4 09/03/2017 1216   BILITOT 0.4 07/25/2011 0454      Component Value Date/Time   TSH 0.909 09/03/2017 1216   Results for Alison Myers (MRN [id number]) as of 09/17/2017 16:12  Ref. Range  09/03/2017 12:16  Vitamin D, 25-Hydroxy Latest Ref Range: 30.0 - 100.0 ng/mL 19.7 (L)   ASSESSMENT AND PLAN: Vitamin D deficiency - Plan: Vitamin D, Ergocalciferol, (DRISDOL) 50000 units CAPS capsule  Insulin resistance  At risk for diabetes mellitus  Class 1 obesity with serious comorbidity and body mass index (BMI) of 33.0 to 33.9 in adult, unspecified obesity type  PLAN:  Vitamin D Deficiency Alison Myers was informed that low vitamin D levels contributes to fatigue and are associated with obesity, breast, and colon cancer. She agrees to start to take prescription Vit D @50 ,000 IU every week #4  with no refills. We will recheck vitamin D level in 3 months and she will follow up for routine testing of vitamin D, at least 2-3 times per year. She was informed of the risk of over-replacement of vitamin D and agrees to not increase her dose unless she discusses this with [place] first.  Insulin Resistance Alison Myers will continue to work on weight loss, exercise, and decreasing simple carbohydrates in her diet to help decrease the risk of diabetes. She was informed that eating too many simple carbohydrates or too many calories at one sitting increases the likelihood of GI side effects. Alison Myers will continue with the category 2 meal plan and we will recheck Hbg A1c and insulin in 3 months. Alison Myers agreed to follow up with [place] as directed to monitor her progress.  Diabetes risk counseling Karelys was given extended (30 minutes) diabetes prevention counseling today. She is 32 y.o. female and has risk factors for diabetes including obesity and insulin resistance. We discussed intensive lifestyle modifications today with an emphasis on weight loss as well as increasing exercise and decreasing simple carbohydrates in her diet.  Obesity Myrtle is currently in the action stage of change. As such, her goal is to continue with weight loss efforts She has agreed to follow the Category 2 plan Jovanda has been instructed to work up  to a goal of 150 minutes of combined cardio and strengthening exercise per week for weight loss and overall health benefits. We discussed the following Behavioral Modification Strategies today: better snacking choices, planning for success, increasing lean protein intake, increasing vegetables and work on meal planning and easy cooking plans  Melida has agreed to follow up with our clinic in 2 weeks. She was informed of the importance of frequent follow up visits to maximize her success with intensive lifestyle modifications for her multiple health conditions.   OBESITY BEHAVIORAL INTERVENTION VISIT  Today's visit was # 2 out of 22.  Starting weight: 182 lbs Starting date: 09/03/17 Today's weight : 179 lbs Today's date: 09/17/2017 Total lbs lost to date: 3 (Patients must lose 7 lbs in the first 6 months to continue with counseling)   ASK: We discussed the diagnosis of obesity with Jon Gills today and Ewelina agreed to give [place] permission to discuss obesity behavioral modification therapy today.  ASSESS: Tamekka has the diagnosis of obesity and her BMI today is 33.84 Tarryn is in the action stage of change   ADVISE: Whittney was educated on the multiple health risks of obesity as well as the benefit of weight loss to improve her health. She was advised of the need for long term treatment and the importance of lifestyle modifications.  AGREE: Multiple dietary modification options and treatment options were discussed and  Jerris agreed to the above obesity treatment plan.  I, [place] Crane, am acting as transcriptionist for Filbert Schilder, MD  I have reviewed the above documentation for accuracy and completeness, and I agree with the above. - Debbra Riding, MD

## 2017-09-30 ENCOUNTER — Ambulatory Visit (INDEPENDENT_AMBULATORY_CARE_PROVIDER_SITE_OTHER): Payer: Commercial Managed Care - PPO | Admitting: Family Medicine

## 2017-09-30 VITALS — BP 133/82 | HR 86 | Temp 98.0°F | Ht 61.0 in | Wt 179.0 lb

## 2017-09-30 DIAGNOSIS — Z6833 Body mass index (BMI) 33.0-33.9, adult: Secondary | ICD-10-CM

## 2017-09-30 DIAGNOSIS — E8881 Metabolic syndrome: Secondary | ICD-10-CM | POA: Diagnosis not present

## 2017-09-30 DIAGNOSIS — E669 Obesity, unspecified: Secondary | ICD-10-CM | POA: Diagnosis not present

## 2017-09-30 DIAGNOSIS — E559 Vitamin D deficiency, unspecified: Secondary | ICD-10-CM | POA: Diagnosis not present

## 2017-09-30 NOTE — Progress Notes (Signed)
Office: [phone]  /  Fax: [phone]   HPI:   Chief Complaint: OBESITY Alison Myers is here to discuss her progress with her obesity treatment plan. She is on the Category 2 plan and is following her eating plan approximately 95 % of the time. She states she is doing boot camp for 45 minutes 6 times per week. Alison Myers is doing well on meal plan. Indulged in pizza 1 time. She denies hunger.  Her weight is 179 lb (81.2 kg) today and has not lost weight since her last visit. She has lost 3 lbs since starting treatment with [place].  Vitamin D Deficiency Alison Myers has a diagnosis of vitamin D deficiency. She is currently taking prescription Vit D and denies nausea, vomiting or muscle weakness.  Insulin Resistance Alison Myers has a diagnosis of insulin resistance based on her elevated fasting insulin level >5. Although Alison Myers's blood glucose readings are still under good control, insulin resistance puts her at greater risk of metabolic syndrome and diabetes. She is not taking metformin currently and continues to work on diet and exercise to decrease risk of diabetes. She denies carbohydrate cravings.  ALLERGIES: Allergies  Allergen Reactions  . Demerol [Meperidine] Hives and Swelling    MEDICATIONS: Current Outpatient Medications on File Prior to Visit  Medication Sig Dispense Refill  . levonorgestrel (MIRENA) 20 MCG/24HR IUD 1 each by Intrauterine route once.    . Vitamin D, Ergocalciferol, (DRISDOL) 50000 units CAPS capsule Take 1 capsule (50,000 Units total) by mouth every 7 (seven) days. 4 capsule 0   No current facility-administered medications on file prior to visit.     PAST MEDICAL HISTORY: Past Medical History:  Diagnosis Date  . Atrial tachycardia (HCC)   . Atrial tachycardia (HCC) 32 years old    PAST SURGICAL HISTORY: Past Surgical History:  Procedure Laterality Date  . ATRIAL ABLATION SURGERY  2009/2012  . BONE GRAFT HIP ILIAC CREST Left 2000    SOCIAL HISTORY: Social History    Tobacco Use  . Smoking status: Never Smoker  . Smokeless tobacco: Never Used  Substance Use Topics  . Alcohol use: Yes  . Drug use: No    FAMILY HISTORY: Family History  Problem Relation Age of Onset  . Diabetes Father   . Hyperlipidemia Father   . Hypertension Father   . Heart disease Father   . Sudden death Father   . Sleep apnea Father   . Obesity Father   . Diabetes Mother   . Hypertension Mother   . Hyperlipidemia Mother   . Thyroid disease Mother   . Sleep apnea Mother   . Obesity Mother     ROS: Review of Systems  Constitutional: Negative for weight loss.  Gastrointestinal: Negative for nausea and vomiting.  Musculoskeletal:       Negative muscle weakness    PHYSICAL EXAM: Blood pressure 133/82, pulse 86, temperature 98 F (36.7 C), temperature source Oral, height 5\' 1"  (1.549 m), weight 179 lb (81.2 kg), SpO2 99 %, unknown if currently breastfeeding. Body mass index is 33.82 kg/m. Physical Exam  Constitutional: She is oriented to person, place, and time. She appears well-developed and well-nourished.  Cardiovascular: Normal rate.  Pulmonary/Chest: Effort normal.  Musculoskeletal: Normal range of motion.  Neurological: She is oriented to person, place, and time.  Skin: Skin is warm and dry.  Psychiatric: She has a normal mood and affect. Her behavior is normal.  Vitals reviewed.   RECENT LABS AND TESTS: BMET    Component Value Date/Time  NA 142 09/03/2017 1216   NA 143 07/25/2011 0454   K 4.4 09/03/2017 1216   K 3.7 07/25/2011 0454   CL 105 09/03/2017 1216   CL 108 (H) 07/25/2011 0454   CO2 19 (L) 09/03/2017 1216   CO2 23 07/25/2011 0454   GLUCOSE 80 09/03/2017 1216   GLUCOSE 87 08/04/2015 1212   GLUCOSE 110 (H) 07/25/2011 0454   BUN 11 09/03/2017 1216   BUN 10 07/25/2011 0454   CREATININE 0.68 09/03/2017 1216   CREATININE 0.73 07/25/2011 0454   CALCIUM 9.5 09/03/2017 1216   CALCIUM 8.7 07/25/2011 0454   GFRNONAA 119 09/03/2017 1216    GFRNONAA >60 07/25/2011 0454   GFRAA 137 09/03/2017 1216   GFRAA >60 07/25/2011 0454   Lab Results  Component Value Date   HGBA1C 5.3 09/03/2017   Lab Results  Component Value Date   INSULIN 12.3 09/03/2017   CBC    Component Value Date/Time   WBC 7.8 09/03/2017 1216   WBC 9.0 08/04/2015 1212   RBC 5.04 09/03/2017 1216   RBC 4.34 08/04/2015 1212   HGB 14.4 09/03/2017 1216   HCT 42.3 09/03/2017 1216   PLT 214 08/04/2015 1212   PLT 175 07/25/2011 0454   MCV 84 09/03/2017 1216   MCV 86 07/25/2011 0454   MCH 28.6 09/03/2017 1216   MCH 28.4 08/04/2015 1212   MCHC 34.0 09/03/2017 1216   MCHC 34.7 08/04/2015 1212   RDW 13.0 09/03/2017 1216   RDW 12.6 07/25/2011 0454   LYMPHSABS 1.8 09/03/2017 1216   EOSABS 0.1 09/03/2017 1216   BASOSABS 0.0 09/03/2017 1216   Iron/TIBC/Ferritin/ %Sat No results found for: IRON, TIBC, FERRITIN, IRONPCTSAT Lipid Panel     Component Value Date/Time   CHOL 141 09/03/2017 1216   TRIG 62 09/03/2017 1216   HDL 49 09/03/2017 1216   LDLCALC 80 09/03/2017 1216   Hepatic Function Panel     Component Value Date/Time   PROT 7.4 09/03/2017 1216   PROT 7.4 07/25/2011 0454   ALBUMIN 4.7 09/03/2017 1216   ALBUMIN 4.2 07/25/2011 0454   AST 16 09/03/2017 1216   AST 17 07/25/2011 0454   ALT 16 09/03/2017 1216   ALT 18 07/25/2011 0454   ALKPHOS 80 09/03/2017 1216   ALKPHOS 60 07/25/2011 0454   BILITOT 0.4 09/03/2017 1216   BILITOT 0.4 07/25/2011 0454      Component Value Date/Time   TSH 0.909 09/03/2017 1216  Results for Alison, Myers (MRN [id number]) as of 09/30/2017 10:50  Ref. Range 09/03/2017 12:16  Vitamin D, 25-Hydroxy Latest Ref Range: 30.0 - 100.0 ng/mL 19.7 (L)    ASSESSMENT AND PLAN: Vitamin D deficiency  Insulin resistance  Class 1 obesity with serious comorbidity and body mass index (BMI) of 33.0 to 33.9 in adult, unspecified obesity type  PLAN:  Vitamin D Deficiency Alison Myers was informed that low vitamin D levels contributes  to fatigue and are associated with obesity, breast, and colon cancer. Alison Myers agrees to continue taking prescription Vit D @50 ,000 IU every week, no refill needed and will follow up for routine testing of vitamin D, at least 2-3 times per year. She was informed of the risk of over-replacement of vitamin D and agrees to not increase her dose unless she discusses this with [place] first. Alison Myers agrees to follow up with our clinic in 2 weeks.  Insulin Resistance Alison Myers will continue to work on weight loss, exercise, and decreasing simple carbohydrates in her diet to help decrease the risk  of diabetes. We dicussed metformin including benefits and risks. She was informed that eating too many simple carbohydrates or too many calories at one sitting increases the likelihood of GI side effects. Alison Myers declined metformin for now and prescription was not written today. We will recheck labs in 2 months and Alison Myers agrees to follow up with our clinic in 2 weeks as directed to monitor her progress.  Obesity Alison Myers is currently in the action stage of change. As such, her goal is to continue with weight loss efforts She has agreed to keep a food journal with 1250-1350 calories and 80 grams of protein daily Alison Myers has been instructed to work up to a goal of 150 minutes of combined cardio and strengthening exercise per week for weight loss and overall health benefits. We discussed the following Behavioral Modification Strategies today: increasing lean protein intake, work on meal planning and easy cooking plans, increase H20 intake, keeping healthy foods in the home, better snacking choices, planning for success, and keep a strict food journal   Alison Myers has agreed to follow up with our clinic in 2 weeks. She was informed of the importance of frequent follow up visits to maximize her success with intensive lifestyle modifications for her multiple health conditions.   OBESITY BEHAVIORAL INTERVENTION VISIT  Today's visit was # 3 out  of 22.  Starting weight: 182 lbs Starting date: 09/03/17 Today's weight : 179 lbs Today's date: 09/30/2017 Total lbs lost to date: 3 (Patients must lose 7 lbs in the first 6 months to continue with counseling)   ASK: We discussed the diagnosis of obesity with Alison Myers today and Alison Myers agreed to give [place] permission to discuss obesity behavioral modification therapy today.  ASSESS: Alison Myers has the diagnosis of obesity and her BMI today is 33.84 Alison Myers is in the action stage of change   ADVISE: Alison Myers was educated on the multiple health risks of obesity as well as the benefit of weight loss to improve her health. She was advised of the need for long term treatment and the importance of lifestyle modifications.  AGREE: Multiple dietary modification options and treatment options were discussed and  Alison Myers agreed to the above obesity treatment plan.  I, Alison Myers, am acting as transcriptionist for Alison Riding, MD  I have reviewed the above documentation for accuracy and completeness, and I agree with the above. - Alison Riding, MD

## 2017-10-15 ENCOUNTER — Ambulatory Visit (INDEPENDENT_AMBULATORY_CARE_PROVIDER_SITE_OTHER): Payer: Commercial Managed Care - PPO | Admitting: Family Medicine

## 2017-10-15 VITALS — BP 112/77 | HR 80 | Temp 98.6°F | Ht 61.0 in | Wt 179.0 lb

## 2017-10-15 DIAGNOSIS — E559 Vitamin D deficiency, unspecified: Secondary | ICD-10-CM | POA: Diagnosis not present

## 2017-10-15 DIAGNOSIS — R0602 Shortness of breath: Secondary | ICD-10-CM | POA: Diagnosis not present

## 2017-10-15 DIAGNOSIS — Z6833 Body mass index (BMI) 33.0-33.9, adult: Secondary | ICD-10-CM

## 2017-10-15 DIAGNOSIS — Z9189 Other specified personal risk factors, not elsewhere classified: Secondary | ICD-10-CM | POA: Diagnosis not present

## 2017-10-15 DIAGNOSIS — E8881 Metabolic syndrome: Secondary | ICD-10-CM | POA: Diagnosis not present

## 2017-10-15 DIAGNOSIS — E669 Obesity, unspecified: Secondary | ICD-10-CM | POA: Diagnosis not present

## 2017-10-15 MED ORDER — VITAMIN D (ERGOCALCIFEROL) 1.25 MG (50000 UNIT) PO CAPS: 50000.0000 [IU] | ORAL_CAPSULE | ORAL | 0 refills | Status: DC

## 2017-10-15 MED ORDER — METFORMIN HCL 500 MG PO TABS: 500.0000 mg | ORAL_TABLET | Freq: Every day | ORAL | 0 refills | Status: DC

## 2017-10-16 NOTE — Progress Notes (Signed)
Office: [phone]  /  Fax: [phone]   HPI:   Chief Complaint: OBESITY Alison Myers is here to discuss her progress with her obesity treatment plan. She is on the keep a food journal with 1250 to 1350 calories and 80 grams of protein daily and is following her eating plan approximately 98 % of the time. She states she is exercising 45 minutes 7 times per week. Alison Myers is very frustrated she hasn't lost more weight. She is trying to monitor her intake and she previously ate 1800 calories and monitored protein intake to get > 100 grams. Her weight is 179 lb (81.2 kg) today and has maintained weight over a period of 2 weeks since her last visit. She has lost 3 lbs since starting treatment with [place].  Shortness of Breath Alison Myers is noticing some shortness of breath.   Vitamin D deficiency Alison Myers has a diagnosis of vitamin D deficiency. She is currently taking vit D and denies nausea, vomiting or muscle weakness.  Insulin Resistance Alison Myers has a diagnosis of insulin resistance based on her elevated fasting insulin level >5. Although Alison Myers's blood glucose readings are still under good control, insulin resistance puts her at greater risk of metabolic syndrome and diabetes. She is not taking metformin currently and continues to work on diet and exercise to decrease risk of diabetes. She admits occasional hunger and denies hypoglycemia or carb cravings.  At risk for diabetes Alison Myers is at higher than average risk for developing diabetes due to her obesity. She currently denies polyuria or polydipsia.  ALLERGIES: Allergies  Allergen Reactions  . Demerol [Meperidine] Hives and Swelling    MEDICATIONS: Current Outpatient Medications on File Prior to Visit  Medication Sig Dispense Refill  . levonorgestrel (MIRENA) 20 MCG/24HR IUD 1 each by Intrauterine route once.     No current facility-administered medications on file prior to visit.     PAST MEDICAL HISTORY: Past Medical History:  Diagnosis Date   . Atrial tachycardia (HCC)   . Atrial tachycardia (HCC) 32 years old    PAST SURGICAL HISTORY: Past Surgical History:  Procedure Laterality Date  . ATRIAL ABLATION SURGERY  2009/2012  . BONE GRAFT HIP ILIAC CREST Left 2000    SOCIAL HISTORY: Social History   Tobacco Use  . Smoking status: Never Smoker  . Smokeless tobacco: Never Used  Substance Use Topics  . Alcohol use: Yes  . Drug use: No    FAMILY HISTORY: Family History  Problem Relation Age of Onset  . Diabetes Father   . Hyperlipidemia Father   . Hypertension Father   . Heart disease Father   . Sudden death Father   . Sleep apnea Father   . Obesity Father   . Diabetes Mother   . Hypertension Mother   . Hyperlipidemia Mother   . Thyroid disease Mother   . Sleep apnea Mother   . Obesity Mother     ROS: Review of Systems  Constitutional: Negative for weight loss.  Gastrointestinal: Negative for nausea and vomiting.  Genitourinary: Negative for frequency.  Musculoskeletal:       Negative for muscle weakness  Endo/Heme/Allergies: Negative for polydipsia.       Positive for polyphagia Negative for hypoglycemia Negative for carb cravings    PHYSICAL EXAM: Blood pressure 112/77, pulse 80, temperature 98.6 F (37 C), height 5\' 1"  (1.549 m), weight 179 lb (81.2 kg), SpO2 100 %, unknown if currently breastfeeding. Body mass index is 33.82 kg/m. Physical Exam  Constitutional: She is oriented  to person, place, and time. She appears well-developed and well-nourished.  Cardiovascular: Normal rate.  Pulmonary/Chest: Effort normal.  Musculoskeletal: Normal range of motion.  Neurological: She is oriented to person, place, and time.  Skin: Skin is warm and dry.  Psychiatric: She has a normal mood and affect. Her behavior is normal.  Vitals reviewed.   RECENT LABS AND TESTS: BMET    Component Value Date/Time   NA 142 09/03/2017 1216   NA 143 07/25/2011 0454   K 4.4 09/03/2017 1216   K 3.7 07/25/2011  0454   CL 105 09/03/2017 1216   CL 108 (H) 07/25/2011 0454   CO2 19 (L) 09/03/2017 1216   CO2 23 07/25/2011 0454   GLUCOSE 80 09/03/2017 1216   GLUCOSE 87 08/04/2015 1212   GLUCOSE 110 (H) 07/25/2011 0454   BUN 11 09/03/2017 1216   BUN 10 07/25/2011 0454   CREATININE 0.68 09/03/2017 1216   CREATININE 0.73 07/25/2011 0454   CALCIUM 9.5 09/03/2017 1216   CALCIUM 8.7 07/25/2011 0454   GFRNONAA 119 09/03/2017 1216   GFRNONAA >60 07/25/2011 0454   GFRAA 137 09/03/2017 1216   GFRAA >60 07/25/2011 0454   Lab Results  Component Value Date   HGBA1C 5.3 09/03/2017   Lab Results  Component Value Date   INSULIN 12.3 09/03/2017   CBC    Component Value Date/Time   WBC 7.8 09/03/2017 1216   WBC 9.0 08/04/2015 1212   RBC 5.04 09/03/2017 1216   RBC 4.34 08/04/2015 1212   HGB 14.4 09/03/2017 1216   HCT 42.3 09/03/2017 1216   PLT 214 08/04/2015 1212   PLT 175 07/25/2011 0454   MCV 84 09/03/2017 1216   MCV 86 07/25/2011 0454   MCH 28.6 09/03/2017 1216   MCH 28.4 08/04/2015 1212   MCHC 34.0 09/03/2017 1216   MCHC 34.7 08/04/2015 1212   RDW 13.0 09/03/2017 1216   RDW 12.6 07/25/2011 0454   LYMPHSABS 1.8 09/03/2017 1216   EOSABS 0.1 09/03/2017 1216   BASOSABS 0.0 09/03/2017 1216   Iron/TIBC/Ferritin/ %Sat No results found for: IRON, TIBC, FERRITIN, IRONPCTSAT Lipid Panel     Component Value Date/Time   CHOL 141 09/03/2017 1216   TRIG 62 09/03/2017 1216   HDL 49 09/03/2017 1216   LDLCALC 80 09/03/2017 1216   Hepatic Function Panel     Component Value Date/Time   PROT 7.4 09/03/2017 1216   PROT 7.4 07/25/2011 0454   ALBUMIN 4.7 09/03/2017 1216   ALBUMIN 4.2 07/25/2011 0454   AST 16 09/03/2017 1216   AST 17 07/25/2011 0454   ALT 16 09/03/2017 1216   ALT 18 07/25/2011 0454   ALKPHOS 80 09/03/2017 1216   ALKPHOS 60 07/25/2011 0454   BILITOT 0.4 09/03/2017 1216   BILITOT 0.4 07/25/2011 0454      Component Value Date/Time   TSH 0.909 09/03/2017 1216   Results for  ZELLA, DEWAN (MRN [id number]) as of 10/16/2017 15:05  Ref. Range 09/03/2017 12:16  Vitamin D, 25-Hydroxy Latest Ref Range: 30.0 - 100.0 ng/mL 19.7 (L)   ASSESSMENT AND PLAN: Insulin resistance - Plan: metFORMIN (GLUCOPHAGE) 500 MG tablet  Vitamin D deficiency - Plan: Vitamin D, Ergocalciferol, (DRISDOL) 50000 units CAPS capsule  At risk for diabetes mellitus  Shortness of breath on exertion  Class 1 obesity with serious comorbidity and body mass index (BMI) of 33.0 to 33.9 in adult, unspecified obesity type  PLAN:  Shortness of Breath Indirect calorimeter was ordered today and showed VO2 of 298  and a REE of 2072.  Vitamin D Deficiency Alison Myers was informed that low vitamin D levels contributes to fatigue and are associated with obesity, breast, and colon cancer. She agrees to continue to take prescription Vit D @50 ,000 IU every week #4 with no refills and will follow up for routine testing of vitamin D, at least 2-3 times per year. She was informed of the risk of over-replacement of vitamin D and agrees to not increase her dose unless she discusses this with [place] first. Alison Myers agrees to follow up with our clinic in 2 weeks.  Insulin Resistance Alison Myers will continue to work on weight loss, exercise, and decreasing simple carbohydrates in her diet to help decrease the risk of diabetes. We dicussed metformin including benefits and risks. She was informed that eating too many simple carbohydrates or too many calories at one sitting increases the likelihood of GI side effects. Alison Myers agrees to start metformin 500 mg qd #30 with no refills and follow up with [place] as directed to monitor her progress.  Diabetes risk counseling Alison Myers was given extended (15 minutes) diabetes prevention counseling today. She is 32 y.o. female and has risk factors for diabetes including obesity and insulin resistance. We discussed intensive lifestyle modifications today with an emphasis on weight loss as well as increasing exercise  and decreasing simple carbohydrates in her diet.  Obesity Alison Myers is currently in the action stage of change. As such, her goal is to continue with weight loss efforts She has agreed to follow the Category 3 plan +200 calories Alison Myers has been instructed to work up to a goal of 150 minutes of combined cardio and strengthening exercise per week for weight loss and overall health benefits. We discussed the following Behavioral Modification Strategies today: planning for success, increasing lean protein intake and work on meal planning and easy cooking plans  Alison Myers has agreed to follow up with our clinic in 2 weeks. She was informed of the importance of frequent follow up visits to maximize her success with intensive lifestyle modifications for her multiple health conditions.   OBESITY BEHAVIORAL INTERVENTION VISIT  Today's visit was # 4 out of 22.  Starting weight: 182 lbs Starting date: 09/03/17 Today's weight : 179 lbs  Today's date: 10/15/2017 Total lbs lost to date: 3 (Patients must lose 7 lbs in the first 6 months to continue with counseling)   ASK: We discussed the diagnosis of obesity with Alison Myers today and Alison Myers agreed to give [place] permission to discuss obesity behavioral modification therapy today.  ASSESS: Alison Myers has the diagnosis of obesity and her BMI today is 33.84 Alison Myers is in the action stage of change   ADVISE: Alison Myers was educated on the multiple health risks of obesity as well as the benefit of weight loss to improve her health. She was advised of the need for long term treatment and the importance of lifestyle modifications.  AGREE: Multiple dietary modification options and treatment options were discussed and  Alison Myers agreed to the above obesity treatment plan.  I, Nevada Crane, am acting as transcriptionist for Filbert Schilder, MD  I have reviewed the above documentation for accuracy and completeness, and I agree with the above. - Debbra Riding, MD

## 2017-10-30 ENCOUNTER — Ambulatory Visit (INDEPENDENT_AMBULATORY_CARE_PROVIDER_SITE_OTHER): Payer: Commercial Managed Care - PPO | Admitting: Family Medicine

## 2018-02-03 ENCOUNTER — Telehealth: Payer: Self-pay

## 2018-02-03 NOTE — Telephone Encounter (Signed)
Copied from CRM [id number]. Topic: Appointment Scheduling - Scheduling Inquiry for Clinic >> Feb 03, 2018 10:20 AM Waymon Amato wrote: Pt has a new patient appt with [facility] on 02/28/18 but has been to the er for possible food poisoning or a viral infection and they would like her to be seen either today or tomorrow  For an Er follow up -[facility] does not see an opening   Best number [phone]  Patient has been scheduled for ER follow up with [facility] for possible food poisoning on 02-04-18.

## 2018-02-04 ENCOUNTER — Encounter: Payer: Self-pay | Admitting: Family Medicine

## 2018-02-04 ENCOUNTER — Telehealth: Payer: Self-pay | Admitting: Family Medicine

## 2018-02-04 ENCOUNTER — Ambulatory Visit: Payer: Commercial Managed Care - PPO | Admitting: Family Medicine

## 2018-02-04 VITALS — BP 118/72 | HR 75 | Temp 98.7°F | Ht 61.0 in | Wt 183.0 lb

## 2018-02-04 DIAGNOSIS — R197 Diarrhea, unspecified: Secondary | ICD-10-CM

## 2018-02-04 DIAGNOSIS — K219 Gastro-esophageal reflux disease without esophagitis: Secondary | ICD-10-CM | POA: Diagnosis not present

## 2018-02-04 DIAGNOSIS — R1084 Generalized abdominal pain: Secondary | ICD-10-CM

## 2018-02-04 DIAGNOSIS — R112 Nausea with vomiting, unspecified: Secondary | ICD-10-CM

## 2018-02-04 LAB — POCT URINALYSIS DIPSTICK
Bilirubin, UA: NEGATIVE
GLUCOSE UA: NEGATIVE
Ketones, UA: NEGATIVE
LEUKOCYTES UA: NEGATIVE
NITRITE UA: NEGATIVE
PH UA: 6 (ref 5.0–8.0)
Protein, UA: NEGATIVE
RBC UA: NEGATIVE
UROBILINOGEN UA: 2 U/dL — AB

## 2018-02-04 MED ORDER — SODIUM CHLORIDE 0.9 % IV SOLN
125.00 | INTRAVENOUS | Status: DC
Start: ? — End: 2018-02-04

## 2018-02-04 MED ORDER — OMEPRAZOLE 40 MG PO CPDR: 40.0000 mg | DELAYED_RELEASE_CAPSULE | Freq: Every day | ORAL | 3 refills | Status: DC

## 2018-02-04 MED ORDER — ONDANSETRON HCL 4 MG PO TABS: 4.0000 mg | ORAL_TABLET | Freq: Three times a day (TID) | ORAL | 2 refills | Status: DC | PRN

## 2018-02-04 NOTE — Patient Instructions (Signed)
Comprehensive metabolic panel  Result Value Ref Range  Sodium 141 135 - 145 mmol/L  Potassium 3.4 (L) 3.5 - 5.0 mmol/L  Chloride 104 98 - 107 mmol/L  CO2 27.0 22.0 - 30.0 mmol/L  Anion Gap 10 9 - 15 mmol/L  BUN 9 7 - 21 mg/dL  Creatinine 0.69 0.60 - 1.00 mg/dL  BUN/Creatinine Ratio 13  EGFR CKD-EPI Non-African American, Female >90 >=60 mL/min/1.39m  EGFR CKD-EPI African American, Female >90 >=60 mL/min/1.739m Glucose 98 65 - 179 mg/dL  Calcium 8.9 8.5 - 10.2 mg/dL  Albumin 4.0 3.5 - 5.0 g/dL  Total Protein 6.9 6.5 - 8.3 g/dL  Total Bilirubin 0.4 0.0 - 1.2 mg/dL  AST 30 14 - 38 U/L  ALT 58 (H) 15 - 48 U/L  Alkaline Phosphatase 71 38 - 126 U/L  Lipase  Result Value Ref Range  Lipase 136 44 - 232 U/L  hCG, Quantitative, Pregnancy  Result Value Ref Range  hCG Quant, Blood <5.0 0.0 - 5.0 IU/L  Troponin I  Result Value Ref Range  Troponin I <0.034 <0.034 ng/mL  CBC w/ Differential  Result Value Ref Range  WBC 8.8 4.5 - 11.0 10*9/L  RBC 4.63 4.00 - 5.20 10*12/L  HGB 13.2 12.0 - 16.0 g/dL  HCT 39.1 36.0 - 46.0 %  MCV 84.5 80.0 - 100.0 fL  MCH 28.4 26.0 - 34.0 pg  MCHC 33.6 31.0 - 37.0 g/dL  RDW 12.9 12.0 - 15.0 %  MPV 6.9 (L) 7.0 - 10.0 fL  Platelet 278 150 - 440 10*9/L  Neutrophils % 66.5 %  Lymphocytes % 21.4 %  Monocytes % 7.5 %  Eosinophils % 2.4 %  Basophils % 0.4 %  Absolute Neutrophils 5.8 2.0 - 7.5 10*9/L  Absolute Lymphocytes 1.9 1.5 - 5.0 10*9/L  Absolute Monocytes 0.7 0.2 - 0.8 10*9/L  Absolute Eosinophils 0.2 0.0 - 0.4 10*9/L  Absolute Basophils 0.0 0.0 - 0.1 10*9/L  Large Unstained Cells 2 0 - 4 %

## 2018-02-04 NOTE — Progress Notes (Signed)
Subjective:    Patient ID: Alison Myers, female    DOB: Feb 03, 1986, 32 y.o.   MRN: [id number]  HPI   Patient presents to clinic to establish with PCP and for ER follow up. She went to ER on 02/02/18 for generalized ABD pain, nausea, vomiting and diarrhea. Lab work was unremarkable, pregnancy was negative. Abdominal US was unremarkable. Patient was given GI cocktail, nausea medication hydrated with IV normal saline.  States her symptoms have minimally improved since leaving ER. She is barely eating - ate small piece of pizza which stayed down yesterday but threw up the jello she at this AM. Reports if she lets nausea medication wear off for too long she will throw up. She has been trying to sip water, gatorade.  ER labs 02/02/2018:  Comprehensive metabolic panel  Result Value Ref Range  Sodium 141 135 - 145 mmol/L  Potassium 3.4 (L) 3.5 - 5.0 mmol/L  Chloride 104 98 - 107 mmol/L  CO2 27.0 22.0 - 30.0 mmol/L  Anion Gap 10 9 - 15 mmol/L  BUN 9 7 - 21 mg/dL  Creatinine 0.69 0.60 - 1.00 mg/dL  BUN/Creatinine Ratio 13  EGFR CKD-EPI Non-African American, Female >90 >=60 mL/min/1.5m  EGFR CKD-EPI African American, Female >90 >=60 mL/min/1.732m Glucose 98 65 - 179 mg/dL  Calcium 8.9 8.5 - 10.2 mg/dL  Albumin 4.0 3.5 - 5.0 g/dL  Total Protein 6.9 6.5 - 8.3 g/dL  Total Bilirubin 0.4 0.0 - 1.2 mg/dL  AST 30 14 - 38 U/L  ALT 58 (H) 15 - 48 U/L  Alkaline Phosphatase 71 38 - 126 U/L  Lipase  Result Value Ref Range  Lipase 136 44 - 232 U/L  hCG, Quantitative, Pregnancy  Result Value Ref Range  hCG Quant, Blood <5.0 0.0 - 5.0 IU/L  Troponin I  Result Value Ref Range  Troponin I <0.034 <0.034 ng/mL  CBC w/ Differential  Result Value Ref Range  WBC 8.8 4.5 - 11.0 10*9/L  RBC 4.63 4.00 - 5.20 10*12/L  HGB 13.2 12.0 - 16.0 g/dL  HCT 39.1 36.0 - 46.0 %  MCV 84.5 80.0 - 100.0 fL  MCH 28.4 26.0 - 34.0 pg  MCHC 33.6 31.0 - 37.0 g/dL  RDW 12.9 12.0 - 15.0 %  MPV 6.9 (L) 7.0 - 10.0 fL    Platelet 278 150 - 440 10*9/L  Neutrophils % 66.5 %  Lymphocytes % 21.4 %  Monocytes % 7.5 %  Eosinophils % 2.4 %  Basophils % 0.4 %  Absolute Neutrophils 5.8 2.0 - 7.5 10*9/L  Absolute Lymphocytes 1.9 1.5 - 5.0 10*9/L  Absolute Monocytes 0.7 0.2 - 0.8 10*9/L  Absolute Eosinophils 0.2 0.0 - 0.4 10*9/L  Absolute Basophils 0.0 0.0 - 0.1 10*9/L  Large Unstained Cells 2 0 - 4 %   ABD [place] in ER 02/02/18:  FINDINGS:     LIVER: The liver is normal in echogenicity and contour. No focal hepatic lesions. No intrahepatic biliary ductal dilatation. The common bile duct is normal in caliber.    GALLBLADDER: The gallbladder is physiologically distended without internal stones or sludge. Sonographic MuPercell Millerign is negative.No pericholecystic fluid. No gallbladder wall thickening.    PANCREAS: Limited evaluation due to overlying bowel gas. Visualized portion is unremarkable.    SPLEEN: Normal in size and echotexture.    KIDNEYS: Normal in size and echotexture. No solid masses or calculi. No hydronephrosis.    VESSELS: Partially visualized aorta and IVC are unremarkable.     OTHER:  No ascites.      Review of Systems   Constitutional: +fatigue. Negative for fever or chills.  HENT: Negative for congestion, ear pain, sinus pain and sore throat.   Eyes: Negative.   Respiratory: Negative for cough, shortness of breath and wheezing.   Cardiovascular: Negative for chest pain, palpitations and leg swelling.  Gastrointestinal: +abdominal pain, diarrhea, nausea and vomiting.  Genitourinary: Negative for dysuria, frequency and urgency.  Musculoskeletal: Negative for arthralgias and myalgias.  Skin: Negative for color change, rash.  Neurological: Negative for syncope, light-headedness and headaches.  Psychiatric/Behavioral: The patient is not nervous/anxious.       Objective:   Physical Exam  Constitutional: She is oriented to person, place, and time. She appears  well-developed. No distress.  Appears tired  HENT:  Head: Normocephalic and atraumatic.  Eyes: Conjunctivae and EOM are normal. No scleral icterus.  Cardiovascular: Normal rate and regular rhythm.  No murmur heard. Pulmonary/Chest: Effort normal and breath sounds normal. No respiratory distress. She has no wheezes. She has no rales.  Abdominal: Soft. Bowel sounds are normal. She exhibits no mass. There is tenderness. There is no rebound.  +epigastric tenderness  Neurological: She is alert and oriented to person, place, and time.  Gait normal  Skin: Skin is warm and dry. No erythema.  Psychiatric: She has a normal mood and affect. Her behavior is normal.  Nursing note and vitals reviewed.  Vitals:   02/04/18 1618  BP: 118/72  Pulse: 75  Temp: 98.7 F (37.1 C)  SpO2: 98%      Assessment & Plan:    A total of 30  minutes were spent face-to-face with the patient during this encounter and over half of that time was spent on counseling and coordination of care. The patient was counseled on bland diet, lab and [place] results from ER, plan for GI referral.    ABD pain -- Unclear cause for ABD pain. Will do urgent GI referral for further eval/possible endoscopy  N/V/D --Drink clear liquids such as water, gatorade, chick broth (small sips) and slowly advance to bland foods such as toast, white rice, ramen noodles (small bites).  Use zofran as needed for nausea and imodium as needed for diarrhea.  Acid reflux -- I wonder if some of the nausea is related to excess acid. We will start omeprazole to see if this helps  Follow up in 3-4 weeks for recheck. We will call with information regarding GI referral.   If pain worsens, vomiting/diarrhea worsens - advised to go to ER

## 2018-02-04 NOTE — Telephone Encounter (Signed)
Pt was seen in ED at [facility], results can be seen in care everywhere

## 2018-02-04 NOTE — Telephone Encounter (Signed)
She is coming for an ER follow up - but I dont see any ER notes in epic or care everywhere Can we see if we can get ER notes?

## 2018-02-05 ENCOUNTER — Telehealth: Payer: Self-pay

## 2018-02-05 NOTE — Telephone Encounter (Signed)
Sent to [facility] GI for urgent appt

## 2018-02-05 NOTE — Telephone Encounter (Signed)
Copied from CRM [id number]. Topic: Referral - Request >> Feb 05, 2018 10:00 AM Tamela Oddi, NT wrote: Reason for CRM: Patient called and states she seen [facility] and was told that she needed to see GI this week. She got a call and they cant see her until Oct. She is wondering if there is another GI Dr she can see this week. Please advise. Thank you

## 2018-02-28 ENCOUNTER — Ambulatory Visit: Payer: Commercial Managed Care - PPO | Admitting: Family

## 2018-03-05 ENCOUNTER — Other Ambulatory Visit: Payer: Self-pay

## 2018-03-05 ENCOUNTER — Encounter: Payer: Self-pay | Admitting: Family Medicine

## 2018-03-05 ENCOUNTER — Ambulatory Visit: Payer: Commercial Managed Care - PPO | Admitting: Family Medicine

## 2018-03-05 VITALS — BP 116/78 | HR 101 | Temp 98.3°F | Wt 184.0 lb

## 2018-03-05 DIAGNOSIS — E8881 Metabolic syndrome: Secondary | ICD-10-CM

## 2018-03-05 DIAGNOSIS — K253 Acute gastric ulcer without hemorrhage or perforation: Secondary | ICD-10-CM

## 2018-03-05 DIAGNOSIS — J019 Acute sinusitis, unspecified: Secondary | ICD-10-CM

## 2018-03-05 DIAGNOSIS — R1084 Generalized abdominal pain: Secondary | ICD-10-CM | POA: Diagnosis not present

## 2018-03-05 DIAGNOSIS — R638 Other symptoms and signs concerning food and fluid intake: Secondary | ICD-10-CM | POA: Insufficient documentation

## 2018-03-05 LAB — HEMOGLOBIN A1C: Hgb A1c MFr Bld: 5.5 % (ref 4.6–6.5)

## 2018-03-05 MED ORDER — AMOXICILLIN-POT CLAVULANATE 875-125 MG PO TABS: 1.0000 | ORAL_TABLET | Freq: Two times a day (BID) | ORAL | 0 refills | Status: DC

## 2018-03-05 MED ORDER — LISDEXAMFETAMINE DIMESYLATE 30 MG PO CAPS: 30.0000 mg | ORAL_CAPSULE | Freq: Every day | ORAL | 0 refills | Status: DC

## 2018-03-05 NOTE — Progress Notes (Signed)
Subjective:    Patient ID: Alison Myers, female    DOB: 07-21-1985, 32 y.o.   MRN: [id number]  HPI   Patient presents to clinic to follow-up on abdominal pain.  Patient was able to see GI, and was diagnosed with ulcer.  She was treated with short-term course of Carafate and Dexilant.  States Carafate and Dexilant did wonders to help her stomach pain.  States she has stopped both of these medications, but continues to take Prilosec 40 mg daily.  Patient also complains of sinus pain, congestion, thick nasal drainage with green/yellow color, feeling rundown for past 2 weeks.  States sinuses got slightly better for a few days with the use of allergy medication, but symptoms all returned.  No fever or chills.  No cough.  Patient also like to discuss weight loss.  She was going to a health and wellness-weight loss clinic back in April, states she followed there strict diet and did not lose any weight.  States lab work was done indicating that she is insulin resistant.  Patient takes metformin to help with weight loss, but states she has not been able to lose any weight.  Patient also reports she goes to boot camp style workout classes at least 3 times a week.  Patient also reports that she did gain a lot of weight with her second pregnancy, this was 2 years ago.  States during her pregnancy she did but was not diagnosed as gestational diabetic due to A1c being 5.5%.  Patient states she did have to monitor her blood sugar throughout her pregnancy, but did not have to take any insulin.  Patient Active Problem List   Diagnosis Date Noted  . Other fatigue 09/03/2017  . Shortness of breath on exertion 09/03/2017  . Atrial tachycardia (HCC) 09/03/2017  . Tachycardia 12/13/2015  . Current use of beta blocker 09/08/2015  . pregnancy 08/11/2015  . H/O sinus tachycardia 08/11/2015  . daughter with Hyper IgE syndrome 08/11/2015  . Family history of myocardial infarction at age less than 35 08/11/2015  . Family  history of congenital heart defect 08/11/2015   Social History   Tobacco Use  . Smoking status: Never Smoker  . Smokeless tobacco: Never Used  Substance Use Topics  . Alcohol use: Yes   Review of Systems   Constitutional: Negative for chills, fatigue and fever.  HENT: Postive for congestion, ear pain, sinus pain/pressure.   Eyes: Negative.   Respiratory: Negative for cough, shortness of breath and wheezing.   Cardiovascular: Negative for chest pain, palpitations and leg swelling.  Gastrointestinal: Negative for abdominal pain, diarrhea, nausea and vomiting.  Genitourinary: Negative for dysuria, frequency and urgency.  Musculoskeletal: Negative for arthralgias and myalgias.  Skin: Negative for color change, pallor and rash.  Neurological: Negative for syncope, light-headedness and headaches.  Psychiatric/Behavioral: The patient is not nervous/anxious.       Objective:   Physical Exam  Constitutional: She appears well-developed and well-nourished. No distress.  Head: Normocephalic and atraumatic.  Eyes: No discharge. EOM are normal. No scleral icterus.  Neck: Normal range of motion. Neck supple. No tracheal deviation present.  Nose/throat: +thick nasal discharge. +maxillary sinus tenderness.  Ears: Fullness bilateral TMs Cardiovascular: Normal rate, regular rhythm and normal heart sounds.  Pulmonary/Chest: Effort normal and breath sounds normal. No respiratory distress. She has no wheezes. She has no rales.  Abdominal: Soft. Bowel sounds are normal. There is no tenderness.  Neurological: She is alert and oriented to person, place, and time.  Gait normal.  Skin: Skin is warm and dry. No pallor.  Psychiatric: She has a normal mood and affect. Her behavior is normal. Thought content normal.    Nursing note and vitals reviewed.     Vitals:   03/05/18 0805  BP: 116/78  Pulse: (!) 101  Temp: 98.3 F (36.8 C)  SpO2: 98%    Assessment & Plan:    Abdominal pain/ulcer-  source of abdominal pain was also.  Patient has completed of Carafate and Dexilant.  She will continue daily omeprazole for prevention of future ulcer.  Acute sinusitis-patient will take Augmentin twice daily for 10 days.  Patient also will take probiotic tablet once daily to help replace good bacteria in stomach  Also advised to eat daily yogurt such as activity via to also help replace good bacteria.  Weight loss- patient has tried many different diets in the past without much success with weight loss.  Patient will do Vyvanse once daily for next 2 to 4 months to help jump start weight loss.  Patient also given diet to follow-up in her patient instruction print out.  Patient will return to clinic in 1 month for follow-up on weight and medication.  Patient is aware that medication for weight loss is not meant to be long-term, is only meant to start weight loss.  History of insulin resistance-we will get new A1c drawn today  [facility] PMP narcotic registry checked and is appropriate for Vyvanse prescription.  Follow-up in 1 month for weight loss monitoring.

## 2018-03-05 NOTE — Patient Instructions (Signed)
This is  Dr. Tullo's  example of a  "Low GI"  Diet:  It will allow you to lose 4 to 8  lbs  per month if you follow it carefully.  Your goal with exercise is a minimum of 30 minutes of aerobic exercise 5 days per week (Walking does not count once it becomes easy!)    All of the foods can be found at grocery stores and in bulk at [employer].  The Atkins protein bars and shakes are available in more varieties at Target, WalMart and [employer]'s [employer].     7 AM Breakfast:  Choose from the following:  Low carbohydrate Protein  Shakes (I recommend the  Premier Protein chocolate shakes,  EAS AdvantEdge "Carb Control" shakes  Or the Atkins shakes all are under 3 net carbs)     a scrambled egg/bacon/cheese burrito made with Mission's "carb balance" whole wheat tortilla  (about 10 net carbs )  Jim[employer] Deans sells microwaveable frittata (basically a quiche without the pastry crust) that is eaten cold and very convenient way to get your eggs.  8 carbs)  If you make your own protein shakes, avoid bananas and pineapple,  And use low carb greek yogurt or original /unsweetened almond or soy milk    Avoid cereal and bananas, oatmeal and cream of wheat and grits. They are loaded with carbohydrates!   10 AM: high protein snack:  Protein bar by Atkins (the snack size, under 200 cal, usually < 6 net carbs).    A stick of cheese:  Around 1 carb,  100 cal     Dannon Light n Fit Greek Yogurt  (80 cal, 8 carbs)  Other so called "protein bars" and Greek yogurts tend to be loaded with carbohydrates.  Remember, in food advertising, the word "energy" is synonymous for " carbohydrate."  Lunch:   A Sandwich using the bread choices listed, Can use any  Eggs,  lunchmeat, grilled meat or canned tuna), avocado, regular mayo/mustard  and cheese.  A Salad using blue cheese, ranch,  Goddess or vinagrette,  Avoid taco shells, croutons or "confetti" and no "candied nuts" but regular nuts OK.   No pretzels, nabs  or chips.  Pickles and  miniature sweet peppers are a good low carb alternative that provide a "crunch"  The bread is the only source of carbohydrate in a sandwich and  can be decreased by trying some of the attached alternatives to traditional loaf bread   Avoid "Low fat dressings, as well as [place] and [place] dressings They are loaded with sugar!   3 PM/ Mid day  Snack:  Consider  1 ounce of  almonds, walnuts, pistachios, pecans, peanuts,  Macadamia nuts or a nut medley.  Avoid "granola and granola bars "  Mixed nuts are ok in moderation as long as there are no raisins,  cranberries or dried fruit.   KIND bars are OK if you get the low glycemic index variety   Try the prosciutto/mozzarella cheese sticks by Fiorruci  In deli /backery section   High protein      6 PM  Dinner:     Meat/fowl/fish with a green salad, and either broccoli, cauliflower, green beans, spinach, brussel sprouts or  Lima beans. DO NOT BREAD THE PROTEIN!!      There is a low carb pasta by Dreamfield's that is acceptable and tastes great: only 5 digestible carbs/serving.( All grocery stores but BJs carry it ) Several ready made meals are   available low carb:   Try Michel Angelo's chicken piccata or chicken or eggplant parm over low carb pasta.(Lowes and BJs)   Aaron Sanchez's "Carnitas" (pulled pork, no sauce,  0 carbs) or his beef pot roast to make a dinner burrito (at [employer])  Pesto over low carb pasta (bj's sells a good quality pesto in the center refrigerated section of the deli   Try satueeing  Bok Choy with mushroooms as a good side   Green Giant makes a mashed cauliflower that tastes like mashed potatoes  Whole wheat pasta is still full of digestible carbs and  Not as low in glycemic index as Dreamfield's.   Brown rice is still rice,  So skip the rice and noodles if you eat Chinese or Thai (or at least limit to 1/2 cup)  9 PM snack :   Breyer's "low carb" fudgsicle or  ice cream bar (Carb Smart line), or  Weight Watcher's ice  cream bar , or another "no sugar added" ice cream;  a serving of fresh berries/cherries with whipped cream   Cheese or DANNON'S LlGHT N FIT GREEK YOGURT  8 ounces of [place] unsweetened almond/cococunut milk    Treat yourself to a parfait made with whipped cream blueberiies, walnuts and vanilla greek yogurt  Avoid bananas, pineapple, grapes  and watermelon on a regular basis because they are high in sugar.  THINK OF THEM AS DESSERT  Remember that snack Substitutions should be less than 10 NET carbs per serving and meals < 20 carbs. Remember to subtract fiber grams to get the "net carbs."   

## 2018-03-10 ENCOUNTER — Ambulatory Visit: Payer: Commercial Managed Care - PPO | Admitting: Family Medicine

## 2018-03-10 VITALS — BP 136/98 | HR 93 | Temp 98.4°F | Ht 62.0 in | Wt 183.2 lb

## 2018-03-10 DIAGNOSIS — L270 Generalized skin eruption due to drugs and medicaments taken internally: Secondary | ICD-10-CM

## 2018-03-10 DIAGNOSIS — L299 Pruritus, unspecified: Secondary | ICD-10-CM | POA: Diagnosis not present

## 2018-03-10 MED ORDER — FAMOTIDINE 40 MG PO TABS: 40.0000 mg | ORAL_TABLET | Freq: Every day | ORAL | 0 refills | Status: DC

## 2018-03-10 MED ORDER — TRIAMCINOLONE ACETONIDE 40 MG/ML IJ SUSP
40.0000 mg | Freq: Once | INTRAMUSCULAR | Status: AC
  Administered 2018-03-10: 40 mg via INTRAMUSCULAR

## 2018-03-10 MED ORDER — METHYLPREDNISOLONE ACETATE 40 MG/ML IJ SUSP: 40.0000 mg | Freq: Once | INTRAMUSCULAR | Status: DC

## 2018-03-10 MED ORDER — PREDNISONE 10 MG (21) PO TBPK: ORAL_TABLET | ORAL | 0 refills | Status: DC

## 2018-03-10 NOTE — Patient Instructions (Signed)
Stop Augmentin, possible cause of rash

## 2018-03-10 NOTE — Progress Notes (Addendum)
Subjective:    Patient ID: Alison Myers, female    DOB: February 28, 1986, 32 y.o.   MRN: [id number]  HPI   Patient presents to clinic due to itchy rash on both sides of the face behind right ear and going down back of neck.  The first patient thought the rash looks similar to poison ivy, but she has not been outside or been exposed to poison ivy that she knows of.  She started on 2 new medications last week: Augmentin for sinus infection and Vyvanse to help weight loss.  Patient is unsure if she ever took Augmentin in the past, but has taken both Vyvanse and phentermine many years ago for weight loss purposes.  She did take a dose of Benadryl last night which seemed to help improve itching.  Denies any feelings of throat closing, drooling, difficulty breathing.  Patient Active Problem List   Diagnosis Date Noted  . Unable to lose weight 03/05/2018  . 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 Acute gastric ulcer 03/05/2018  . Generalized abdominal pain 03/05/2018  . Other fatigue 09/03/2017  . Shortness of breath on exertion 09/03/2017  . Atrial tachycardia (HCC) 09/03/2017  . Tachycardia 12/13/2015  . Current use of beta blocker 09/08/2015  . pregnancy 08/11/2015  . H/O sinus tachycardia 08/11/2015  . daughter with Hyper IgE syndrome 08/11/2015  . Family history of myocardial infarction at age less than 35 08/11/2015  . Family history of congenital heart defect 08/11/2015   Social History   Tobacco Use  . Smoking status: Never Smoker  . Smokeless tobacco: Never Used  Substance Use Topics  . Alcohol use: Yes   Review of Systems  Constitutional: Negative for chills, fatigue and fever.  HENT: Negative for congestion, ear pain, sinus pain and sore throat.   Eyes: Negative.   Respiratory: Negative for cough, shortness of breath and wheezing.   Cardiovascular: Negative for chest pain, palpitations and leg swelling.  Gastrointestinal: Negative for abdominal pain, diarrhea, nausea and vomiting.    Genitourinary: Negative for dysuria, frequency and urgency.  Musculoskeletal: Negative for arthralgias and myalgias.  Skin: +rash on face/ears/back of neck  Neurological: Negative for syncope, light-headedness and headaches.  Psychiatric/Behavioral: The patient is not nervous/anxious.       Objective:   Physical Exam  Constitutional: She is oriented to person, place, and time. She appears well-nourished. No distress.  HENT:  Head: Normocephalic and atraumatic.  Nose: Nose normal.  Mouth/Throat: Oropharynx is clear and moist. No oropharyngeal exudate.  Eyes: Conjunctivae and EOM are normal. Right eye exhibits no discharge. Left eye exhibits no discharge. No scleral icterus.  Neck: Neck supple. No JVD present. No tracheal deviation present.  Cardiovascular: Normal rate and regular rhythm.  Pulmonary/Chest: Effort normal and breath sounds normal. No stridor. No respiratory distress. She has no wheezes. She has no rales.  Musculoskeletal: She exhibits no edema.  Neurological: She is alert and oriented to person, place, and time. No cranial nerve deficit.  Skin: Rash noted. She is not diaphoretic.  Red welt-like rash on bilateral cheeks of face, behind right ear and down back of neck.   Psychiatric: She has a normal mood and affect. Her behavior is normal.  Nursing note and vitals reviewed.  Vitals:   03/10/18 1028  BP: (!) 136/98  Pulse: 93  Temp: 98.4 F (36.9 C)  SpO2: 98%      Assessment & Plan:   Eruption suspected to be from drug reaction/itch --suspect rash is related to medication, most  likely Augmentin due to not having issues with Vyvanse when taken in the past.  Patient will stop Augmentin she will do prednisone burst, continue taking Claritin 10 mg daily, and also take Pepcid 40 mg daily for 2 weeks for histamine blockade.  Patient also given 40 mg of Kenalog IM in clinic to help calm allergy response. Administrations This Visit    triamcinolone acetonide (KENALOG-40)  injection 40 mg    Admin Date 03/10/2018 Action Given Dose 40 mg Route Intramuscular Administered By Clearnce Sorrel, RMA         Patient will monitor self for improvement.  She will keep regularly scheduled follow-up as planned for recheck on weight loss while taking Vyvanse.  If symptoms do not improve, patient advised to call clinic or go to emergency department for evaluation.

## 2018-03-11 ENCOUNTER — Encounter: Payer: Self-pay | Admitting: Family Medicine

## 2018-04-02 ENCOUNTER — Encounter: Payer: Self-pay | Admitting: Family Medicine

## 2018-04-02 ENCOUNTER — Telehealth: Payer: Self-pay | Admitting: Lab

## 2018-04-02 ENCOUNTER — Ambulatory Visit: Payer: Commercial Managed Care - PPO | Admitting: Family Medicine

## 2018-04-02 VITALS — BP 106/74 | HR 91 | Temp 98.7°F | Ht 62.0 in | Wt 179.8 lb

## 2018-04-02 DIAGNOSIS — R638 Other symptoms and signs concerning food and fluid intake: Secondary | ICD-10-CM

## 2018-04-02 DIAGNOSIS — Z713 Dietary counseling and surveillance: Secondary | ICD-10-CM

## 2018-04-02 DIAGNOSIS — E669 Obesity, unspecified: Secondary | ICD-10-CM | POA: Diagnosis not present

## 2018-04-02 MED ORDER — LISDEXAMFETAMINE DIMESYLATE 30 MG PO CAPS: 30.0000 mg | ORAL_CAPSULE | Freq: Every day | ORAL | 0 refills | Status: DC

## 2018-04-02 NOTE — Telephone Encounter (Signed)
Patient called with updated pharmacy.

## 2018-04-02 NOTE — Progress Notes (Signed)
Subjective:    Patient ID: Alison Myers, female    DOB: 03/14/1986, 32 y.o.   MRN: [id number]  HPI   Patient presents to clinic for follow-up on weight loss after beginning Vyvanse 30 mg once daily.  Patient has been following a healthy diet.  States Alison Myers drinks protein shake in the morning, at lunch Alison Myers usually will have a sandwich with the 0 carb/45-calorie bread from [facility] with ham/cheese/turkey.  States there are days Alison Myers does not even eat the bread at lunch & will just do the meat and cheese.  Dinner usually consists of a protein and lots of vegetables.  Patient states Alison Myers has never been much of a snacker during the day, so often does not eat snacks.  Patient has had no adverse side effects from Vyvanse medication.  Filed Weights   04/02/18 0806  Weight: 179 lb 12.8 oz (81.6 kg)  Weight:    03/10/18      183 lb 3.2 oz   Patient Active Problem List   Diagnosis Date Noted  . Unable to lose weight 03/05/2018  . 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 Acute gastric ulcer 03/05/2018  . Generalized abdominal pain 03/05/2018  . Other fatigue 09/03/2017  . Shortness of breath on exertion 09/03/2017  . Atrial tachycardia (HCC) 09/03/2017  . Tachycardia 12/13/2015  . Current use of beta blocker 09/08/2015  . pregnancy 08/11/2015  . H/O sinus tachycardia 08/11/2015  . daughter with Hyper IgE syndrome 08/11/2015  . Family history of myocardial infarction at age less than 33 08/11/2015  . Family history of congenital heart defect 08/11/2015   Social History   Tobacco Use  . Smoking status: Never Smoker  . Smokeless tobacco: Never Used  Substance Use Topics  . Alcohol use: Yes   Review of Systems  Constitutional: Negative for chills, fatigue and fever.  HENT: Negative for congestion, ear pain, sinus pain and sore throat.   Eyes: Negative.   Respiratory: Negative for cough, shortness of breath and wheezing.   Cardiovascular: Negative for chest pain, palpitations and leg swelling.    Gastrointestinal: Negative for abdominal pain, diarrhea, nausea and vomiting.  Genitourinary: Negative for dysuria, frequency and urgency.  Musculoskeletal: Negative for arthralgias and myalgias.  Skin: Negative for color change, pallor and rash.  Neurological: Negative for syncope, light-headedness and headaches.  Psychiatric/Behavioral: The patient is not nervous/anxious.       Objective:   Physical Exam  Constitutional: Alison Myers is oriented to person, place, and time. Alison Myers appears well-nourished. No distress.  HENT:  Head: Normocephalic and atraumatic.  Eyes: Conjunctivae and EOM are normal. No scleral icterus.  Neck: Neck supple. No tracheal deviation present.  Cardiovascular: Normal rate and regular rhythm.  Pulmonary/Chest: Effort normal and breath sounds normal. No respiratory distress.  Abdominal: Soft. Bowel sounds are normal. Alison Myers exhibits no distension. There is no tenderness.  Musculoskeletal: Normal range of motion. Alison Myers exhibits no edema.  Neurological: Alison Myers is alert and oriented to person, place, and time. No cranial nerve deficit.  Skin: Skin is warm and dry. Alison Myers is not diaphoretic. No pallor.  Psychiatric: Alison Myers has a normal mood and affect. Alison Myers behavior is normal.  Nursing note and vitals reviewed.   Body mass index is 32.89 kg/m.     Vitals:   04/02/18 0806  BP: 106/74  Pulse: 91  Temp: 98.7 F (37.1 C)  SpO2: 96%   Assessment & Plan:   Obesity/Unable to lose weight/weight loss counseling -   patient has lost 4  pounds since beginning Vyvanse.  Alison Myers is following overall good diet.  Encouraged to do regular physical activity to get heart rate up and gets also planning to also help burn calories.  Alison Myers will continue Vyvanse 30 mg and return in 4 weeks for weight recheck.  [facility] PMP narcotic registry checked and is appropriate for Vyvanse refill.  Return to clinic sooner if any issues arise.

## 2018-04-02 NOTE — Telephone Encounter (Signed)
Patient is calling back with the address for the Pharmacy:   [facility]: [address], [place], [place] [address] [facility]: [phone]  Please advise

## 2018-04-02 NOTE — Telephone Encounter (Signed)
[id number] - [place], [place] - [address] [phone] (Phone) °[phone] (Fax)  ° ° °

## 2018-04-02 NOTE — Telephone Encounter (Signed)
Called Pt to find out what pharmacy to send her Rx to. There is no pharmacy [facility] [place].mOkay for Pec to talk to Pt.

## 2018-04-02 NOTE — Addendum Note (Signed)
Addended by: Leanora Cover on: 04/02/2018 09:48 AM   Modules accepted: Orders

## 2018-04-02 NOTE — Telephone Encounter (Signed)
Rx sent to [facility] electronically, please shred the paper Rx Thanks, LG

## 2018-04-02 NOTE — Telephone Encounter (Signed)
Call to patient- left message- did call [facility] and verify they do not have pharmacy on site. [facility] does- but they do not. Please call office with alternative pharmacy.

## 2018-04-30 ENCOUNTER — Ambulatory Visit: Payer: Commercial Managed Care - PPO | Admitting: Family Medicine

## 2018-04-30 ENCOUNTER — Encounter: Payer: Self-pay | Admitting: Family Medicine

## 2018-04-30 VITALS — BP 112/74 | HR 75 | Temp 98.5°F | Ht 62.0 in | Wt 182.6 lb

## 2018-04-30 DIAGNOSIS — R638 Other symptoms and signs concerning food and fluid intake: Secondary | ICD-10-CM

## 2018-04-30 DIAGNOSIS — D841 Defects in the complement system: Secondary | ICD-10-CM

## 2018-04-30 DIAGNOSIS — E559 Vitamin D deficiency, unspecified: Secondary | ICD-10-CM | POA: Diagnosis not present

## 2018-04-30 LAB — COMPREHENSIVE METABOLIC PANEL
ALBUMIN: 4.4 g/dL (ref 3.5–5.2)
ALT: 14 U/L (ref 0–35)
AST: 12 U/L (ref 0–37)
Alkaline Phosphatase: 67 U/L (ref 39–117)
BUN: 13 mg/dL (ref 6–23)
CALCIUM: 9 mg/dL (ref 8.4–10.5)
CHLORIDE: 107 meq/L (ref 96–112)
CO2: 26 mEq/L (ref 19–32)
CREATININE: 0.69 mg/dL (ref 0.40–1.20)
GFR: 105.82 mL/min (ref >60.00)
Glucose, Bld: 98 mg/dL (ref 70–99)
Potassium: 4.1 mEq/L (ref 3.5–5.1)
Sodium: 139 mEq/L (ref 135–145)
Total Bilirubin: 0.4 mg/dL (ref 0.2–1.2)
Total Protein: 7.1 g/dL (ref 6.0–8.3)

## 2018-04-30 LAB — CBC WITH DIFFERENTIAL/PLATELET
BASOS PCT: 0.5 % (ref 0.0–3.0)
Basophils Absolute: 0 10*3/uL (ref 0.0–0.1)
Eosinophils Absolute: 0.1 10*3/uL (ref 0.0–0.7)
Eosinophils Relative: 1.7 % (ref 0.0–5.0)
HEMATOCRIT: 40.2 % (ref 36.0–46.0)
HEMOGLOBIN: 13.9 g/dL (ref 12.0–15.0)
LYMPHS PCT: 29.5 % (ref 12.0–46.0)
Lymphs Abs: 1.8 10*3/uL (ref 0.7–4.0)
MCHC: 34.6 g/dL (ref 30.0–36.0)
MCV: 84.7 fl (ref 78.0–100.0)
MONOS PCT: 8.3 % (ref 3.0–12.0)
Monocytes Absolute: 0.5 10*3/uL (ref 0.1–1.0)
Neutro Abs: 3.7 10*3/uL (ref 1.4–7.7)
Neutrophils Relative %: 60 % (ref 43.0–77.0)
Platelets: 245 10*3/uL (ref 150.0–400.0)
RBC: 4.74 Mil/uL (ref 3.87–5.11)
RDW: 13.2 % (ref 11.5–15.5)
WBC: 6.2 10*3/uL (ref 4.0–10.5)

## 2018-04-30 LAB — VITAMIN D 25 HYDROXY (VIT D DEFICIENCY, FRACTURES): VITD: 16.25 ng/mL — AB (ref 30.00–100.00)

## 2018-04-30 LAB — B12 AND FOLATE PANEL
Folate: 8.4 ng/mL (ref >5.9)
Vitamin B-12: 237 pg/mL (ref 211–911)

## 2018-04-30 LAB — FOLLICLE STIMULATING HORMONE: FSH: 5.3 m[IU]/mL

## 2018-04-30 LAB — LUTEINIZING HORMONE: LH: 6.48 m[IU]/mL

## 2018-04-30 MED ORDER — LISDEXAMFETAMINE DIMESYLATE 60 MG PO CAPS: 60.0000 mg | ORAL_CAPSULE | Freq: Every day | ORAL | 0 refills | Status: DC

## 2018-04-30 NOTE — Progress Notes (Signed)
Subjective:    Patient ID: Alison Myers, female    DOB: 24-Sep-1985, 32 y.o.   MRN: [id number]  HPI   Presents to clinic to follow up on weight loss.  Patient continues to have difficulty with weight loss.  She is taking Vyvanse once a day, has no adverse side effects with this medication.  Denies chest pain, palpitations, feelings of increased anxiety.  She continues to drink a protein shake in the morning, eat a low-carb lunch with sandwich bread from all the that has 0 carbs/45 cal 4 slice, with some ham and cheese or [place].  Some days she does not have bread at lunch and just will do the meats and cheeses with vegetables on the side.  Dinner she usually eats a lean protein like chicken or fish with lots of vegetables.  She usually does not eat many snacks during the day.  Patient states she is becoming frustrated with her inability to lose weight.  Patient also requests C4 testing, states her mother and daughter are both going through testing for hereditary angioedema due to swelling issues, and the doctor her daughter is seeing suggested she also be screened for this as well.  Wt Readings from Last 3 Encounters:  04/30/18 182 lb 9.6 oz (82.8 kg)  04/02/18 179 lb 12.8 oz (81.6 kg)  03/10/18 183 lb 3.2 oz (83.1 kg)    Patient Active Problem List   Diagnosis Date Noted  . Obesity (BMI 30.0-34.9) 04/02/2018  . Weight loss counseling, encounter for 04/02/2018  . Unable to lose weight 03/05/2018  . 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 Acute gastric ulcer 03/05/2018  . Generalized abdominal pain 03/05/2018  . Other fatigue 09/03/2017  . Shortness of breath on exertion 09/03/2017  . Atrial tachycardia (HCC) 09/03/2017  . Tachycardia 12/13/2015  . Current use of beta blocker 09/08/2015  . pregnancy 08/11/2015  . H/O sinus tachycardia 08/11/2015  . daughter with Hyper IgE syndrome 08/11/2015  . Family history of myocardial infarction at age less than 68 08/11/2015  . Family history of  congenital heart defect 08/11/2015   Social History   Tobacco Use  . Smoking status: Never Smoker  . Smokeless tobacco: Never Used  Substance Use Topics  . Alcohol use: Yes    Review of Systems  Constitutional: Negative for chills, fatigue and fever.  HENT: Negative for congestion, ear pain, sinus pain and sore throat.   Eyes: Negative.   Respiratory: Negative for cough, shortness of breath and wheezing.   Cardiovascular: Negative for chest pain, palpitations and leg swelling.  Gastrointestinal: Negative for abdominal pain, diarrhea, nausea and vomiting.  Genitourinary: Negative for dysuria, frequency and urgency.  Musculoskeletal: Negative for arthralgias and myalgias.  Skin: Negative for color change, pallor and rash.  Neurological: Negative for syncope, light-headedness and headaches.  Psychiatric/Behavioral: The patient is not nervous/anxious.       Objective:   Physical Exam  Constitutional: She is oriented to person, place, and time. She appears well-nourished. No distress.  HENT:  Head: Normocephalic and atraumatic.  Eyes: Conjunctivae and EOM are normal. No scleral icterus.  Neck: Neck supple. No tracheal deviation present.  Cardiovascular: Normal rate and regular rhythm.  Pulmonary/Chest: Effort normal and breath sounds normal. No respiratory distress.  Musculoskeletal: She exhibits no edema.  Neurological: She is alert and oriented to person, place, and time.  Skin: Skin is warm and dry. Capillary refill takes less than 2 seconds. No pallor.  Psychiatric: She has a normal mood and affect.  Her behavior is normal.  Nursing note and vitals reviewed.     Vitals:   04/30/18 0808  BP: 112/74  Pulse: 75  Temp: 98.5 F (36.9 C)  SpO2: 98%    Assessment & Plan:   Unable to lose weight-we will trial increasing Vyvanse to 60 mg/day to see if this helps in any way to improve weight loss numbers.  Patient is working hard to follow a lower calorie diet with  high-protein and high vegetable content and less carbs.  We will also do lab work today to further investigate other causes of her inability to lose weight.  We will get CBC, CMP, thyroid panel, vitamin D, B12, LH, FSH, progesterone, estradiol.  We will also test C4 level due to family history of hereditary angioedema.  Patient will follow-up in 4 weeks for recheck on weight loss after increasing Vyvanse dosing.  If patient continues to have issues with weight loss, we can consider endocrinology referral and/or referral to weight loss clinic.

## 2018-05-01 LAB — PROGESTERONE: Progesterone: <0.5 ng/mL

## 2018-05-01 LAB — THYROID PANEL WITH TSH
Free Thyroxine Index: 2.5 (ref 1.4–3.8)
T3 UPTAKE: 30 % (ref 22–35)
T4 TOTAL: 8.4 ug/dL (ref 5.1–11.9)
TSH: 0.96 mIU/L

## 2018-05-01 LAB — C3 AND C4
C3 Complement: 139 mg/dL (ref 83–193)
C4 COMPLEMENT: 22 mg/dL (ref 15–57)

## 2018-05-01 LAB — ESTRADIOL: ESTRADIOL: 90 pg/mL

## 2018-05-01 MED ORDER — VITAMIN D (ERGOCALCIFEROL) 1.25 MG (50000 UNIT) PO CAPS: 50000.0000 [IU] | ORAL_CAPSULE | ORAL | 0 refills | Status: DC

## 2018-05-01 NOTE — Addendum Note (Signed)
Addended by: Leanora Cover on: 05/01/2018 01:02 PM   Modules accepted: Orders

## 2018-05-28 ENCOUNTER — Encounter: Payer: Self-pay | Admitting: Family Medicine

## 2018-05-28 ENCOUNTER — Ambulatory Visit: Payer: Commercial Managed Care - PPO | Admitting: Family Medicine

## 2018-05-28 VITALS — BP 126/88 | HR 80 | Temp 98.3°F | Ht 62.0 in | Wt 171.6 lb

## 2018-05-28 DIAGNOSIS — Z713 Dietary counseling and surveillance: Secondary | ICD-10-CM | POA: Diagnosis not present

## 2018-05-28 DIAGNOSIS — R638 Other symptoms and signs concerning food and fluid intake: Secondary | ICD-10-CM

## 2018-05-28 MED ORDER — LISDEXAMFETAMINE DIMESYLATE 60 MG PO CAPS: 60.0000 mg | ORAL_CAPSULE | Freq: Every day | ORAL | 0 refills | Status: DC

## 2018-05-28 NOTE — Progress Notes (Signed)
Subjective:    Patient ID: Alison Myers, female    DOB: 1986/05/14, 32 y.o.   MRN: [id number]  HPI  Presents to clinic for 4 week follow up on weight loss. At last visit we increase vyvanse to 60mg  per day.  She has been tolerating elevated dose of Vyvanse without any issues.  Denies any chest pain, palpitations, shortness of breath, feeling sweaty/faint or dizzy.  Patient continues to follow a healthy diet with low carbs, higher protein and lots of vegetables.  She also drinks plenty of water per day.  She is started doing cardio 2 times per day 45 minutes in the morning and 45 minutes afternoon as well as strength training a few times throughout the week.   Wt Readings from Last 3 Encounters:  04/30/18 182 lb 9.6 oz (82.8 kg)  04/02/18 179 lb 12.8 oz (81.6 kg)  03/10/18 183 lb 3.2 oz (83.1 kg)   Patient Active Problem List   Diagnosis Date Noted  . Obesity (BMI 30.0-34.9) 04/02/2018  . Weight loss counseling, encounter for 04/02/2018  . Unable to lose weight 03/05/2018  . 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 Acute gastric ulcer 03/05/2018  . Generalized abdominal pain 03/05/2018  . Other fatigue 09/03/2017  . Shortness of breath on exertion 09/03/2017  . Atrial tachycardia (HCC) 09/03/2017  . Tachycardia 12/13/2015  . Current use of beta blocker 09/08/2015  . pregnancy 08/11/2015  . H/O sinus tachycardia 08/11/2015  . daughter with Hyper IgE syndrome 08/11/2015  . Family history of myocardial infarction at age less than 64 08/11/2015  . Family history of congenital heart defect 08/11/2015   Social History   Tobacco Use  . Smoking status: Never Smoker  . Smokeless tobacco: Never Used  Substance Use Topics  . Alcohol use: Yes    Review of Systems   Constitutional: Negative for chills, fatigue and fever.  HENT: Negative for congestion, ear pain, sinus pain and sore throat.   Eyes: Negative.   Respiratory: Negative for cough, shortness of breath and wheezing.     Cardiovascular: Negative for chest pain, palpitations and leg swelling.  Gastrointestinal: Negative for abdominal pain, diarrhea, nausea and vomiting.  Genitourinary: Negative for dysuria, frequency and urgency.  Musculoskeletal: Negative for arthralgias and myalgias.  Skin: Negative for color change, pallor and rash.  Neurological: Negative for syncope, light-headedness and headaches.  Psychiatric/Behavioral: The patient is not nervous/anxious.       Objective:   Physical Exam   Constitutional: She appears well-developed and well-nourished. No distress.  HENT:  Head: Normocephalic and atraumatic.  Eyes: EOM are normal. No scleral icterus.  Neck: Normal range of motion. Neck supple. No tracheal deviation present.  Cardiovascular: Normal rate, regular rhythm and normal heart sounds.  Pulmonary/Chest: Effort normal and breath sounds normal. No respiratory distress. She has no wheezes. She has no rales.  Abdominal: Soft. Bowel sounds are normal. There is no tenderness.  Neurological: She is alert and oriented to person, place, and time.  Gait normal  Skin: Skin is warm and dry. No pallor.  Psychiatric: She has a normal mood and affect. Her behavior is normal. Thought content normal.  Nursing note and vitals reviewed.   Today's Vitals   05/28/18 1113  BP: 126/88  Pulse: 80  Temp: 98.3 F (36.8 C)  TempSrc: Oral  SpO2: 98%  Weight: 171 lb 9.6 oz (77.8 kg)  Height: 5\' 2"  (1.575 m)   Body mass index is 31.39 kg/m.  Patient has lost 11 pounds since  last visit.    Assessment & Plan:   Weight loss monitoring/unable to lose weight - since increasing dose of Vyvanse patient has started to be gone to have weight loss success.  Advised to continue Vyvanse at 60 mg dose, refill sent to pharmacy.  Patient also encouraged to continue healthy eating and regular physical activity as she has been doing.  Patient will follow-up in approximately 4 weeks for continued monitoring of weight  loss.  Patient aware she can return to clinic sooner if any issues arise.

## 2018-06-26 ENCOUNTER — Encounter: Payer: Self-pay | Admitting: Family Medicine

## 2018-06-26 ENCOUNTER — Ambulatory Visit: Payer: Commercial Managed Care - PPO | Admitting: Family Medicine

## 2018-06-26 VITALS — BP 104/60 | HR 94 | Temp 98.8°F | Resp 18 | Ht 62.0 in | Wt 166.0 lb

## 2018-06-26 DIAGNOSIS — R638 Other symptoms and signs concerning food and fluid intake: Secondary | ICD-10-CM

## 2018-06-26 DIAGNOSIS — Z713 Dietary counseling and surveillance: Secondary | ICD-10-CM

## 2018-06-26 MED ORDER — LISDEXAMFETAMINE DIMESYLATE 60 MG PO CAPS: 60.0000 mg | ORAL_CAPSULE | Freq: Every day | ORAL | 0 refills | Status: DC

## 2018-06-26 NOTE — Progress Notes (Signed)
Subjective:    Patient ID: Alison Myers, female    DOB: October 07, 1985, 33 y.o.   MRN: [id number]  HPI  Patient presents to clinic for follow-up on weight loss while taking Vyvanse.  Patient continues to follow healthy diet and exercise regularly.  She eats a low carbs, and lots of lean protein and vegetables.  She is doing cardio at least once per day tries to get there twice per day.  Also does weight training a few times a week.  Patient denies any chest pain, palpitations, shortness of breath or elevated heart rate.  Patient has had no retractions with this medication.   Patient Active Problem List   Diagnosis Date Noted  . Obesity (BMI 30.0-34.9) 04/02/2018  . Weight loss counseling, encounter for 04/02/2018  . Unable to lose weight 03/05/2018  . 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 Acute gastric ulcer 03/05/2018  . Generalized abdominal pain 03/05/2018  . Other fatigue 09/03/2017  . Shortness of breath on exertion 09/03/2017  . Atrial tachycardia (HCC) 09/03/2017  . Tachycardia 12/13/2015  . Current use of beta blocker 09/08/2015  . pregnancy 08/11/2015  . H/O sinus tachycardia 08/11/2015  . daughter with Hyper IgE syndrome 08/11/2015  . Family history of myocardial infarction at age less than 50 08/11/2015  . Family history of congenital heart defect 08/11/2015   Social History   Tobacco Use  . Smoking status: Never Smoker  . Smokeless tobacco: Never Used  Substance Use Topics  . Alcohol use: Yes   Review of Systems  Constitutional: Negative for chills, fatigue and fever.  HENT: Negative for congestion, ear pain, sinus pain and sore throat.   Eyes: Negative.   Respiratory: Negative for cough, shortness of breath and wheezing.   Cardiovascular: Negative for chest pain, palpitations and leg swelling.  Gastrointestinal: Negative for abdominal pain, diarrhea, nausea and vomiting.  Genitourinary: Negative for dysuria, frequency and urgency.  Musculoskeletal: Negative for  arthralgias and myalgias.  Skin: Negative for color change, pallor and rash.  Neurological: Negative for syncope, light-headedness and headaches.  Psychiatric/Behavioral: The patient is not nervous/anxious.       Objective:   Physical Exam  Constitutional: She appears well-developed and well-nourished. No distress.  HENT:  Head: Normocephalic and atraumatic.  Eyes: EOM are normal. No scleral icterus.  Neck: Normal range of motion. Neck supple. No tracheal deviation present.  Cardiovascular: Normal rate, regular rhythm and normal heart sounds.  Pulmonary/Chest: Effort normal and breath sounds normal. No respiratory distress. She has no wheezes. She has no rales.  Abdominal: Soft. Bowel sounds are normal. There is no tenderness.  Neurological: She is alert and oriented to person, place, and time.  Gait normal  Skin: Skin is warm and dry. No pallor.  Psychiatric: She has a normal mood and affect. Her behavior is normal. Thought content normal.   Nursing note and vitals reviewed.   Wt Readings from Last 3 Encounters:  06/26/18 166 lb (75.3 kg)  05/28/18 171 lb 9.6 oz (77.8 kg)  04/30/18 182 lb 9.6 oz (82.8 kg)      Vitals:   06/26/18 1615  BP: 104/60  Pulse: 94  Resp: 18  Temp: 98.8 F (37.1 C)  SpO2: 94%   Assessment & Plan:   Weight loss counseling - patient has lost a total of 16 pounds since beginning her weight loss journey in November 2019.  Patient will continue working on healthy diet and regular exercise.  She will continue current Vyvanse dose as this has  been effective and she is tolerating without any issue.  Her end goal is to get to between 135 to 140 pounds, is hopeful she can reach this goal over the course of the next 6 to 8 months.  [place] PMP registry checked and is appropriate for refill.  Patient will follow-up here in approximately 1 month for recheck on weight.

## 2018-06-26 NOTE — Patient Instructions (Signed)
Wt Readings from Last 3 Encounters:  06/26/18 166 lb (75.3 kg)  05/28/18 171 lb 9.6 oz (77.8 kg)  04/30/18 182 lb 9.6 oz (82.8 kg)

## 2018-06-27 ENCOUNTER — Encounter: Payer: Self-pay | Admitting: Family Medicine

## 2018-07-22 ENCOUNTER — Other Ambulatory Visit: Payer: Self-pay | Admitting: Family Medicine

## 2018-07-22 DIAGNOSIS — E559 Vitamin D deficiency, unspecified: Secondary | ICD-10-CM

## 2018-07-24 ENCOUNTER — Ambulatory Visit: Payer: Commercial Managed Care - PPO | Admitting: Family Medicine

## 2018-07-25 ENCOUNTER — Encounter: Payer: Self-pay | Admitting: Family Medicine

## 2018-07-25 ENCOUNTER — Ambulatory Visit: Payer: Commercial Managed Care - PPO | Admitting: Family Medicine

## 2018-07-25 VITALS — BP 96/60 | HR 84 | Temp 98.4°F | Resp 16 | Ht 62.0 in | Wt 161.8 lb

## 2018-07-25 DIAGNOSIS — R638 Other symptoms and signs concerning food and fluid intake: Secondary | ICD-10-CM | POA: Diagnosis not present

## 2018-07-25 DIAGNOSIS — Z713 Dietary counseling and surveillance: Secondary | ICD-10-CM | POA: Diagnosis not present

## 2018-07-25 MED ORDER — LISDEXAMFETAMINE DIMESYLATE 60 MG PO CAPS: 60.0000 mg | ORAL_CAPSULE | Freq: Every day | ORAL | 0 refills | Status: DC

## 2018-07-25 NOTE — Progress Notes (Signed)
Subjective:    Patient ID: Alison Myers, female    DOB: 1986-06-06, 33 y.o.   MRN: [id number]  HPI   Patient presents to clinic for follow-up on her weight loss while taking Vyvanse and eating a very healthy diet and doing regular exercise at least 5 days/week.  She is trying to do both cardio training and weightlifting to keep up physical activity.  She follows a diet high in lean proteins, lots of vegetables and eats fewer carbohydrates and minimal sugars.  Denies any issues with Vyvanse, no chest pain no shortness of breath no palpitations.   Patient Active Problem List   Diagnosis Date Noted  . Obesity (BMI 30.0-34.9) 04/02/2018  . Weight loss counseling, encounter for 04/02/2018  . Unable to lose weight 03/05/2018  . 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 Acute gastric ulcer 03/05/2018  . Generalized abdominal pain 03/05/2018  . Other fatigue 09/03/2017  . Shortness of breath on exertion 09/03/2017  . Atrial tachycardia (HCC) 09/03/2017  . Tachycardia 12/13/2015  . Current use of beta blocker 09/08/2015  . pregnancy 08/11/2015  . H/O sinus tachycardia 08/11/2015  . daughter with Hyper IgE syndrome 08/11/2015  . Family history of myocardial infarction at age less than 61 08/11/2015  . Family history of congenital heart defect 08/11/2015   Social History   Tobacco Use  . Smoking status: Never Smoker  . Smokeless tobacco: Never Used  Substance Use Topics  . Alcohol use: Yes   Review of Systems   Constitutional: Negative for chills, fatigue and fever.  HENT: Negative for congestion, ear pain, sinus pain and sore throat.   Eyes: Negative.   Respiratory: Negative for cough, shortness of breath and wheezing.   Cardiovascular: Negative for chest pain, palpitations and leg swelling.  Gastrointestinal: Negative for abdominal pain, diarrhea, nausea and vomiting.  Genitourinary: Negative for dysuria, frequency and urgency.  Musculoskeletal: Negative for arthralgias and myalgias.    Skin: Negative for color change, pallor and rash.  Neurological: Negative for syncope, light-headedness and headaches.  Psychiatric/Behavioral: The patient is not nervous/anxious.       Objective:   Physical Exam  Constitutional: She appears well-developed and well-nourished. No distress.  HENT:  Head: Normocephalic and atraumatic.  Eyes: Pupils are equal, round, and reactive to light. EOM are normal. No scleral icterus.  Neck: Normal range of motion. Neck supple. No tracheal deviation present.  Cardiovascular: Normal rate, regular rhythm and normal heart sounds.  Pulmonary/Chest: Effort normal and breath sounds normal. No respiratory distress. Abdominal: Soft. Bowel sounds are normal. There is no tenderness.  Neurological: She is alert and oriented to person, place, and time.  Gait normal  Skin: Skin is warm and dry. No pallor.  Psychiatric: She has a normal mood and affect. Her behavior is normal. Thought content normal.   Nursing note and vitals reviewed.   Wt Readings from Last 3 Encounters:  07/25/18 161 lb 12.8 oz (73.4 kg)  06/26/18 166 lb (75.3 kg)  05/28/18 171 lb 9.6 oz (77.8 kg)   Weight November 2019: 182 lb    Vitals:   07/25/18 1551  BP: 96/60  Pulse: 84  Resp: 16  Temp: 98.4 F (36.9 C)  SpO2: 100%    Assessment & Plan:   Weight loss counseling/unable to lose weight-prior to starting Vyvanse patient had tried multiple diets without success in weight loss.  With Vyvanse in combination with weight loss and regular exercise she has been very successful with her weight loss thus far.  Since starting this weight loss journey she is lost a total of 21 pounds.  She will continue taking Vyvanse at current dose, PMP registry checked and is appropriate for refill.  She will continue healthy diet and her regular exercise.  Patient will follow-up here in approximately 3 months for continued monitoring of her weight loss.  Advised to call clinic when she needs refill.   Patient is doing 59-month follow-ups now due to being extremely successful with each 1 month follow-up, so I feel comfortable and confident she will continue to have success.

## 2018-09-10 ENCOUNTER — Telehealth: Payer: Self-pay | Admitting: Family Medicine

## 2018-09-10 NOTE — Telephone Encounter (Signed)
Copied from CRM [id number]. Topic: Quick Communication - Rx Refill/Question >> Sep 10, 2018 10:26 AM Reggie Pile, NT wrote: Medication:  lisdexamfetamine (VYVANSE) 60 MG capsule   Has the patient contacted their pharmacy? Yes, patient advised to call MD to let her know she needs a refill.   Preferred Pharmacy (with phone number or street name):  [id number] [place], [place] - [address] AT [facility] OF [address] [phone] (Phone) [phone] (Fax)  Agent: Please be advised that RX refills may take up to 3 business days. We ask that you follow-up with your pharmacy.

## 2018-09-11 ENCOUNTER — Telehealth: Payer: Self-pay | Admitting: Lab

## 2018-09-11 DIAGNOSIS — R638 Other symptoms and signs concerning food and fluid intake: Secondary | ICD-10-CM

## 2018-09-11 MED ORDER — LISDEXAMFETAMINE DIMESYLATE 60 MG PO CAPS: 60.0000 mg | ORAL_CAPSULE | Freq: Every day | ORAL | 0 refills | Status: DC

## 2018-09-11 NOTE — Telephone Encounter (Signed)
Pt called [facility] Medication: Refill lisdexamfetamine (VYVANSE) 60 MG capsule

## 2018-09-11 NOTE — Telephone Encounter (Signed)
Called Pt No answer left a VM to tell her Rx was sent to the pharmacy, if she has any questions to call the office.

## 2018-10-23 ENCOUNTER — Ambulatory Visit: Payer: Commercial Managed Care - PPO | Admitting: Family Medicine

## 2018-11-13 ENCOUNTER — Other Ambulatory Visit: Payer: Self-pay

## 2018-11-17 ENCOUNTER — Ambulatory Visit: Payer: Commercial Managed Care - PPO | Admitting: Family Medicine

## 2018-11-17 ENCOUNTER — Other Ambulatory Visit: Payer: Self-pay

## 2018-11-17 ENCOUNTER — Encounter: Payer: Self-pay | Admitting: Family Medicine

## 2018-11-17 VITALS — BP 110/64 | HR 96 | Temp 98.3°F | Resp 16 | Ht 62.0 in | Wt 168.6 lb

## 2018-11-17 DIAGNOSIS — R638 Other symptoms and signs concerning food and fluid intake: Secondary | ICD-10-CM

## 2018-11-17 DIAGNOSIS — Z713 Dietary counseling and surveillance: Secondary | ICD-10-CM | POA: Diagnosis not present

## 2018-11-17 MED ORDER — LISDEXAMFETAMINE DIMESYLATE 70 MG PO CAPS: 70.0000 mg | ORAL_CAPSULE | Freq: Every day | ORAL | 0 refills | Status: DC

## 2018-11-17 MED ORDER — NALTREXONE-BUPROPION HCL ER 8-90 MG PO TB12: ORAL_TABLET | ORAL | 1 refills | Status: DC

## 2018-11-17 NOTE — Progress Notes (Signed)
Subjective:    Patient ID: Alison Myers, female    DOB: [birth year], 33 y.o.   MRN: [id number]  HPI   Patient resents clinic for follow-up on weight management.  She is currently taking Vyvanse 60 mg daily and overall has worked well for weight loss.  Patient feels she has plateaued on this dose.  She is actually gained about 7 pounds back over the course of the last 3 months.  Patient has not been able to go to the gym due to COVID-19 closures however she has been walking between 4 to 6 miles a day and doing it at least 1 workout video 3-5 times a week.  Patient continues to restrict calories.  Usually has a whole-wheat bagel for breakfast, some sort of wrap type sandwich for lunch and a protein and vegetable for dinner.  Works hard to keep up with water intake.  Denies any palpitations, lower extremity swelling chest pain or feelings of shortness of breath while taking the Vyvanse.  Patient Active Problem List   Diagnosis Date Noted  . Obesity (BMI 30.0-34.9) 04/02/2018  . Weight loss counseling, encounter for 04/02/2018  . Unable to lose weight 03/05/2018  . 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 Acute gastric ulcer 03/05/2018  . Generalized abdominal pain 03/05/2018  . Other fatigue 09/03/2017  . Shortness of breath on exertion 09/03/2017  . Atrial tachycardia (HCC) 09/03/2017  . Tachycardia 12/13/2015  . Current use of beta blocker 09/08/2015  . pregnancy 08/11/2015  . H/O sinus tachycardia 08/11/2015  . daughter with Hyper IgE syndrome 08/11/2015  . Family history of myocardial infarction at age less than 32 08/11/2015  . Family history of congenital heart defect 08/11/2015   Social History   Tobacco Use  . Smoking status: Never Smoker  . Smokeless tobacco: Never Used  Substance Use Topics  . Alcohol use: Yes   Review of Systems  Constitutional: Negative for chills, fatigue and fever.  HENT: Negative for congestion, ear pain, sinus pain and sore throat.   Eyes: Negative.    Respiratory: Negative for cough, shortness of breath and wheezing.   Cardiovascular: Negative for chest pain, palpitations and leg swelling.  Gastrointestinal: Negative for abdominal pain, diarrhea, nausea and vomiting.  Genitourinary: Negative for dysuria, frequency and urgency.  Musculoskeletal: Negative for arthralgias and myalgias.  Skin: Negative for color change, pallor and rash.  Neurological: Negative for syncope, light-headedness and headaches.  Psychiatric/Behavioral: The patient is not nervous/anxious.       Objective:   Physical Exam Vitals signs and nursing note reviewed.  Constitutional:      General: She is not in acute distress.    Appearance: She is not toxic-appearing.  HENT:     Head: Normocephalic and atraumatic.  Eyes:     General: No scleral icterus.    Extraocular Movements: Extraocular movements intact.     Conjunctiva/sclera: Conjunctivae normal.  Cardiovascular:     Rate and Rhythm: Normal rate and regular rhythm.  Pulmonary:     Effort: Pulmonary effort is normal. No respiratory distress.     Breath sounds: Normal breath sounds.  Musculoskeletal:     Right lower leg: No edema.     Left lower leg: No edema.  Skin:    General: Skin is warm and dry.     Coloration: Skin is not pale.  Neurological:     General: No focal deficit present.     Mental Status: She is alert and oriented to person, place, and time.  Gait: Gait normal.  Psychiatric:        Mood and Affect: Mood normal.        Behavior: Behavior normal.     Wt Readings from Last 3 Encounters:  07/25/18 161 lb 12.8 oz (73.4 kg)  06/26/18 166 lb (75.3 kg)  05/28/18 171 lb 9.6 oz (77.8 kg)   Today's Vitals   11/17/18 0828  BP: 110/64  Pulse: 96  Resp: 16  Temp: 98.3 F (36.8 C)  TempSrc: Oral  SpO2: 100%  Weight: 168 lb 9.6 oz (76.5 kg)  Height: 5\' 2"  (1.575 m)   Body mass index is 30.84 kg/m.     Assessment & Plan:    Weight loss counseling/unable to lose weight- we  will bump patient's Vyvanse up to the max dose of 70 mg/day.  [facility] restrictor is appropriate for this medication.  Patient become a little frustrated with her plateau in weight loss and actually gaining some weight back.  Patient is interested in trialing Contrave to see if it has any sort of difference or any effect in helping her with her weight loss journey.  Patient will begin Contrave and is aware she has to taper up to the max dose.  Also discussed changes in her diet such as getting more strict on carbohydrate intake.  We will have patient follow-up in 1 month for recheck on weight and how she is doing since beginning the Contrave.

## 2018-12-17 ENCOUNTER — Other Ambulatory Visit: Payer: Self-pay

## 2018-12-17 ENCOUNTER — Telehealth: Payer: Self-pay | Admitting: Family Medicine

## 2018-12-17 ENCOUNTER — Ambulatory Visit (INDEPENDENT_AMBULATORY_CARE_PROVIDER_SITE_OTHER): Payer: Commercial Managed Care - PPO | Admitting: Family Medicine

## 2018-12-17 VITALS — Wt 156.7 lb

## 2018-12-17 DIAGNOSIS — R638 Other symptoms and signs concerning food and fluid intake: Secondary | ICD-10-CM

## 2018-12-17 DIAGNOSIS — Z713 Dietary counseling and surveillance: Secondary | ICD-10-CM

## 2018-12-17 MED ORDER — NALTREXONE-BUPROPION HCL ER 8-90 MG PO TB12: ORAL_TABLET | ORAL | 2 refills | Status: DC

## 2018-12-17 MED ORDER — LISDEXAMFETAMINE DIMESYLATE 70 MG PO CAPS: 70.0000 mg | ORAL_CAPSULE | Freq: Every day | ORAL | 0 refills | Status: DC

## 2018-12-17 NOTE — Telephone Encounter (Signed)
Called Pt No answer left a VM to call office to schedule 83month F/U appt. Will try back later

## 2018-12-17 NOTE — Telephone Encounter (Signed)
Please schedule 3 month follow up visit for patient  Thanks  LG

## 2018-12-17 NOTE — Progress Notes (Signed)
Patient ID: Alison Myers, female   DOB: Mar 25, 1986, 33 y.o.   MRN: [id number]    Virtual Visit via video Note  This visit type was conducted due to national recommendations for restrictions regarding the COVID-19 pandemic (e.g. social distancing).  This format is felt to be most appropriate for this patient at this time.  All issues noted in this document were discussed and addressed.  No physical exam was performed (except for noted visual exam findings with Video Visits).   I connected with Theodore Demark today at  8:20 AM EDT by a video enabled telemedicine application or telephone and verified that I am speaking with the correct person using two identifiers. Location patient: home Location provider: work or home office Persons participating in the virtual visit: patient, provider  I discussed the limitations, risks, security and privacy concerns of performing an evaluation and management service by video and the availability of in person appointments. I also discussed with the patient that there may be a patient responsible charge related to this service. The patient expressed understanding and agreed to proceed.  HPI:  Patient and I connected via video follow-up on weight loss.  Patient initially was taking just Vyvanse to help control appetite and was doing very well.  She hit a plateau a couple of months ago and actually gained a little bit of weight back.  Patient has been working extremely hard with healthy diet choices, eating lower carbohydrate foods, increasing vegetable and water intake as well as working out 1-2 times a day and walking multiple times a week.  Even with the gym's close throughout the pandemic, patient has been doing at home workout videos and extending her walks to help keep up her physical activity.  Last month we discussed different options and decided to add Contrave on as well to help better control appetite and again help to jump start weight loss.  Patient tapered off  her dose to now the stabilization dose of 2 tablets twice per day.  Denies having any adverse side effects with the Contrave.  Patient continues to follow a healthy diet and exercise regularly.  And she has lost 12 pounds since last visit.  Patient is very pleased with her progress.  Patient's goal weight is around 135 to 140 pounds.    ROS:  Constitutional: Negative for chills, fatigue and fever.  HENT: Negative for congestion, ear pain, sinus pain and sore throat.   Eyes: Negative.   Respiratory: Negative for cough, shortness of breath and wheezing.   Cardiovascular: Negative for chest pain, palpitations and leg swelling.  Gastrointestinal: Negative for abdominal pain, diarrhea, nausea and vomiting.  Genitourinary: Negative for dysuria, frequency and urgency.  Musculoskeletal: Negative for arthralgias and myalgias.  Skin: Negative for color change, pallor and rash.  Neurological: Negative for syncope, light-headedness and headaches.  Psychiatric/Behavioral: The patient is not nervous/anxious.     Past Medical History:  Diagnosis Date  . Atrial tachycardia ([place])   . Atrial tachycardia ([place]) 33 years old    Past Surgical History:  Procedure Laterality Date  . ATRIAL ABLATION SURGERY  2009/2012  . BONE GRAFT HIP ILIAC CREST Left 2000  . heart ablation  2009 2012    Family History  Problem Relation Age of Onset  . Diabetes Father   . Hyperlipidemia Father   . Hypertension Father   . Heart disease Father   . Sudden death Father   . Sleep apnea Father   . Obesity Father   . Diabetes  Mother   . Hypertension Mother   . Hyperlipidemia Mother   . Thyroid disease Mother   . Sleep apnea Mother   . Obesity Mother   . Asthma Mother   . COPD Mother   . Asthma Brother   . Depression Brother   . Diabetes Brother   . Hyperlipidemia Brother   . Hypertension Brother   . Asthma Maternal Grandmother   . Diabetes Maternal Grandmother   . Kidney disease Maternal Grandmother   .  Hyperlipidemia Maternal Grandfather   . Hypertension Maternal Grandfather   . Kidney disease Maternal Grandfather   . Diabetes Paternal Grandmother   . Diabetes Paternal Grandfather    Social History   Tobacco Use  . Smoking status: Never Smoker  . Smokeless tobacco: Never Used  Substance Use Topics  . Alcohol use: Yes    Current Outpatient Medications:  .  levonorgestrel (MIRENA) 20 MCG/24HR IUD, 1 each by Intrauterine route once., Disp: , Rfl:  .  lisdexamfetamine (VYVANSE) 70 MG capsule, Take 1 capsule (70 mg total) by mouth daily., Disp: 30 capsule, Rfl: 0 .  Naltrexone-buPROPion HCl ER 8-90 MG TB12, 1 tab in AM for 1 week. Then 1 tab 2x a day for 1 week, then 2 tabs in AM and 1 tab in PM for 1 week, then 2 tabs 2x a day thereafter., Disp: 120 tablet, Rfl: 1 .  Vitamin D, Ergocalciferol, (DRISDOL) 1.25 MG (50000 UT) CAPS capsule, TAKE 1 CAPSULE BY MOUTH EVERY 7 DAYS, Disp: 12 capsule, Rfl: 0 .  famotidine (PEPCID) 40 MG tablet, Take 1 tablet (40 mg total) by mouth daily for 14 days., Disp: 14 tablet, Rfl: 0  EXAM:  Today's Vitals   12/17/18 1013  Weight: 156 lb 11.2 oz (71.1 kg)   Body mass index is 28.66 kg/m.  Wt Readings from Last 3 Encounters:  11/17/18 168 lb 9.6 oz (76.5 kg)  07/25/18 161 lb 12.8 oz (73.4 kg)  06/26/18 166 lb (75.3 kg)   GENERAL: alert, oriented, appears well and in no acute distress  HEENT: atraumatic, conjunttiva clear, no obvious abnormalities on inspection of external nose and ears  NECK: normal movements of the head and neck  LUNGS: on inspection no signs of respiratory distress, breathing rate appears normal, no obvious gross SOB, gasping or wheezing  CV: no obvious cyanosis  MS: moves all visible extremities without noticeable abnormality  PSYCH/NEURO: pleasant and cooperative, no obvious depression or anxiety, speech and thought processing grossly intact  ASSESSMENT AND PLAN:  Discussed the following assessment and plan:  Weight  loss counseling - patient will continue Vyvanse and Contrave.  Weight loss is gotten over the plateau and is back on track.  Patient is pleased with her progress.  She will continue to follow healthy diet and regular exercise. PMP registry reviewed and is appropriate for refill   I discussed the assessment and treatment plan with the patient. The patient was provided an opportunity to ask questions and all were answered. The patient agreed with the plan and demonstrated an understanding of the instructions.   The patient was advised to call back or seek an in-person evaluation if the symptoms worsen or if the condition fails to improve as anticipated.  Tracey Harries, FNP

## 2018-12-22 NOTE — Telephone Encounter (Signed)
Called Pt No answer left a VM to call office to schedule 3month F/U appt. Will try back later 

## 2018-12-23 ENCOUNTER — Other Ambulatory Visit: Payer: Self-pay | Admitting: Lab

## 2018-12-25 ENCOUNTER — Telehealth: Payer: Self-pay | Admitting: Lab

## 2018-12-25 NOTE — Telephone Encounter (Signed)
Called Pt No answer left a VM to call office to schedule 3month F/U appt. Will try back later 

## 2018-12-29 NOTE — Telephone Encounter (Signed)
Called Pt No answer No VM. Will try back later 

## 2019-01-01 ENCOUNTER — Encounter: Payer: Self-pay | Admitting: Lab

## 2019-01-01 ENCOUNTER — Telehealth: Payer: Self-pay | Admitting: Lab

## 2019-01-01 NOTE — Telephone Encounter (Signed)
Called Pt several times No answer No VM

## 2019-01-01 NOTE — Telephone Encounter (Signed)
Mailed a  letter to Pt to call the office and schedule a 3 month F/U appt

## 2019-01-01 NOTE — Telephone Encounter (Signed)
Called Pt several times No answer No VM, To schedule 3 month F/U with NP Philis Nettle

## 2019-01-01 NOTE — Telephone Encounter (Signed)
OK thanks, can we send a reminder letter to call back for appt?  LG

## 2019-01-16 NOTE — Progress Notes (Signed)
error 

## 2019-03-04 ENCOUNTER — Other Ambulatory Visit: Payer: Self-pay

## 2019-03-04 ENCOUNTER — Emergency Department
Admission: EM | Admit: 2019-03-04 | Discharge: 2019-03-04 | Disposition: A | Discharge status: Home / Self Care | Payer: 59 | Attending: Emergency Medicine | Admitting: Emergency Medicine

## 2019-03-04 DIAGNOSIS — R52 Pain, unspecified: Secondary | ICD-10-CM | POA: Diagnosis not present

## 2019-03-04 DIAGNOSIS — Z79899 Other long term (current) drug therapy: Secondary | ICD-10-CM | POA: Diagnosis not present

## 2019-03-04 DIAGNOSIS — Y92410 Unspecified street and highway as the place of occurrence of the external cause: Secondary | ICD-10-CM | POA: Insufficient documentation

## 2019-03-04 DIAGNOSIS — Y939 Activity, unspecified: Secondary | ICD-10-CM | POA: Insufficient documentation

## 2019-03-04 DIAGNOSIS — M7918 Myalgia, other site: Secondary | ICD-10-CM

## 2019-03-04 DIAGNOSIS — S1989XA Other specified injuries of other specified part of neck, initial encounter: Secondary | ICD-10-CM | POA: Diagnosis present

## 2019-03-04 DIAGNOSIS — Y999 Unspecified external cause status: Secondary | ICD-10-CM | POA: Diagnosis not present

## 2019-03-04 DIAGNOSIS — S161XXA Strain of muscle, fascia and tendon at neck level, initial encounter: Secondary | ICD-10-CM | POA: Diagnosis not present

## 2019-03-04 MED ORDER — IBUPROFEN 600 MG PO TABS: 600.0000 mg | ORAL_TABLET | Freq: Three times a day (TID) | ORAL | 0 refills | Status: DC | PRN

## 2019-03-04 MED ORDER — LIDOCAINE 5 % EX PTCH
1.0000 | MEDICATED_PATCH | CUTANEOUS | Status: DC
  Administered 2019-03-04: 1 via TRANSDERMAL
  Filled 2019-03-04: qty 1

## 2019-03-04 MED ORDER — CYCLOBENZAPRINE HCL 10 MG PO TABS: 10.0000 mg | ORAL_TABLET | Freq: Three times a day (TID) | ORAL | 0 refills | Status: DC | PRN
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
## 2019-03-05 ENCOUNTER — Ambulatory Visit (INDEPENDENT_AMBULATORY_CARE_PROVIDER_SITE_OTHER): Payer: 59

## 2019-03-05 ENCOUNTER — Ambulatory Visit (INDEPENDENT_AMBULATORY_CARE_PROVIDER_SITE_OTHER): Payer: 59 | Admitting: Family Medicine

## 2019-03-05 ENCOUNTER — Telehealth: Payer: Self-pay | Admitting: Family Medicine

## 2019-03-05 ENCOUNTER — Other Ambulatory Visit: Payer: Self-pay

## 2019-03-05 DIAGNOSIS — S161XXS Strain of muscle, fascia and tendon at neck level, sequela: Secondary | ICD-10-CM

## 2019-03-05 DIAGNOSIS — S4992XA Unspecified injury of left shoulder and upper arm, initial encounter: Secondary | ICD-10-CM | POA: Diagnosis not present

## 2019-03-05 DIAGNOSIS — M25512 Pain in left shoulder: Secondary | ICD-10-CM

## 2019-03-05 DIAGNOSIS — S161XXD Strain of muscle, fascia and tendon at neck level, subsequent encounter: Secondary | ICD-10-CM

## 2019-03-05 DIAGNOSIS — S199XXA Unspecified injury of neck, initial encounter: Secondary | ICD-10-CM | POA: Diagnosis not present

## 2019-03-05 DIAGNOSIS — R638 Other symptoms and signs concerning food and fluid intake: Secondary | ICD-10-CM

## 2019-03-05 DIAGNOSIS — S161XXA Strain of muscle, fascia and tendon at neck level, initial encounter: Secondary | ICD-10-CM | POA: Diagnosis not present

## 2019-03-05 DIAGNOSIS — M542 Cervicalgia: Secondary | ICD-10-CM | POA: Diagnosis not present

## 2019-03-05 MED ORDER — LISDEXAMFETAMINE DIMESYLATE 70 MG PO CAPS: 70.0000 mg | ORAL_CAPSULE | Freq: Every day | ORAL | 0 refills | Status: DC

## 2019-03-05 MED ORDER — METHYLPREDNISOLONE 4 MG PO TBPK: ORAL_TABLET | ORAL | 0 refills | Status: DC

## 2019-03-05 MED ORDER — METHYLPREDNISOLONE ACETATE 40 MG/ML IJ SUSP
40.0000 mg | Freq: Once | INTRAMUSCULAR | Status: AC
  Administered 2019-03-05: 40 mg via INTRAMUSCULAR

## 2019-03-05 MED ORDER — KETOROLAC TROMETHAMINE 60 MG/2ML IM SOLN
60.0000 mg | Freq: Once | INTRAMUSCULAR | Status: AC
  Administered 2019-03-05: 60 mg via INTRAMUSCULAR

## 2019-03-05 NOTE — Telephone Encounter (Signed)
Med refill
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
## 2019-04-02 ENCOUNTER — Ambulatory Visit (INDEPENDENT_AMBULATORY_CARE_PROVIDER_SITE_OTHER): Payer: 59 | Admitting: Family Medicine

## 2019-04-02 ENCOUNTER — Other Ambulatory Visit: Payer: Self-pay

## 2019-04-02 ENCOUNTER — Encounter: Payer: Self-pay | Admitting: Family Medicine

## 2019-04-02 VITALS — Ht 62.0 in | Wt 165.0 lb

## 2019-04-02 DIAGNOSIS — Z713 Dietary counseling and surveillance: Secondary | ICD-10-CM | POA: Diagnosis not present

## 2019-04-02 DIAGNOSIS — R638 Other symptoms and signs concerning food and fluid intake: Secondary | ICD-10-CM

## 2019-04-02 MED ORDER — TOPIRAMATE 25 MG PO TABS: 25.0000 mg | ORAL_TABLET | Freq: Two times a day (BID) | ORAL | 1 refills | Status: DC

## 2019-04-02 MED ORDER — LISDEXAMFETAMINE DIMESYLATE 70 MG PO CAPS: 70.0000 mg | ORAL_CAPSULE | Freq: Every day | ORAL | 0 refills | Status: DC

## 2019-04-02 NOTE — Progress Notes (Signed)
Patient ID: Alison Myers, female   DOB: [birth year], 33 y.o.   MRN: [id number]    Virtual Visit via video Note  This visit type was conducted due to national recommendations for restrictions regarding the COVID-19 pandemic (e.g. social distancing).  This format is felt to be most appropriate for this patient at this time.  All issues noted in this document were discussed and addressed.  No physical exam was performed (except for noted visual exam findings with Video Visits).   I connected with Drenda Ferryman today at 10:20 AM EDT by a video enabled telemedicine application and verified that I am speaking with the correct person using two identifiers. Location patient: home Location provider: work or home office Persons participating in the virtual visit: patient, provider  I discussed the limitations, risks, security and privacy concerns of performing an evaluation and management service by telephone and the availability of in person appointments. I also discussed with the patient that there may be a patient responsible charge related to this service. The patient expressed understanding and agreed to proceed.  HPI:  Patient and I connected via video to follow-up on weight loss.  Patient is down from 170 our visit in September to 165.  Patient ideally would like to get in the 130 range, this is the way she was prior to having her son.  Patient is getting frustrated with her weight loss progress.  Patient was hopeful that adding on the Contrave would have more benefit.  She continues to take Vyvanse to help decrease appetite overall, monitors her calories eats around 1200 cal/day.  Really tries to eat healthy choices including veggies, lean proteins and lots of water.  Works out at least 4-5 times a week.  Patient has seen the weight loss clinic, Dr. Leafy Ro, previously and had lots of blood work done, tried multiple different strategies when her weight began to plateau, but was still unable to lose  weight and they could not find anything abnormal in her lab work to explain why they were having difficulty with weight loss.   ROS: See pertinent positives and negatives per HPI.  Past Medical History:  Diagnosis Date  . Atrial tachycardia ([place])   . Atrial tachycardia ([place]) 33 years old    Past Surgical History:  Procedure Laterality Date  . ATRIAL ABLATION SURGERY  2009/2012  . BONE GRAFT HIP ILIAC CREST Left 2000  . heart ablation  2009 2012    Family History  Problem Relation Age of Onset  . Diabetes Father   . Hyperlipidemia Father   . Hypertension Father   . Heart disease Father   . Sudden death Father   . Sleep apnea Father   . Obesity Father   . Diabetes Mother   . Hypertension Mother   . Hyperlipidemia Mother   . Thyroid disease Mother   . Sleep apnea Mother   . Obesity Mother   . Asthma Mother   . COPD Mother   . Asthma Brother   . Depression Brother   . Diabetes Brother   . Hyperlipidemia Brother   . Hypertension Brother   . Asthma Maternal Grandmother   . Diabetes Maternal Grandmother   . Kidney disease Maternal Grandmother   . Hyperlipidemia Maternal Grandfather   . Hypertension Maternal Grandfather   . Kidney disease Maternal Grandfather   . Diabetes Paternal Grandmother   . Diabetes Paternal Grandfather    Social History   Tobacco Use  . Smoking status: Never Smoker  . Smokeless  tobacco: Never Used  Substance Use Topics  . Alcohol use: Yes    Current Outpatient Medications:  .  cyclobenzaprine (FLEXERIL) 10 MG tablet, Take 1 tablet (10 mg total) by mouth 3 (three) times daily as needed., Disp: 15 tablet, Rfl: 0 .  ibuprofen (ADVIL) 600 MG tablet, Take 1 tablet (600 mg total) by mouth every 8 (eight) hours as needed., Disp: 15 tablet, Rfl: 0 .  levonorgestrel (MIRENA) 20 MCG/24HR IUD, 1 each by Intrauterine route once., Disp: , Rfl:  .  lisdexamfetamine (VYVANSE) 70 MG capsule, Take 1 capsule (70 mg total) by mouth daily., Disp: 30 capsule,  Rfl: 0 .  Vitamin D, Ergocalciferol, (DRISDOL) 1.25 MG (50000 UT) CAPS capsule, TAKE 1 CAPSULE BY MOUTH EVERY 7 DAYS, Disp: 12 capsule, Rfl: 0 .  topiramate (TOPAMAX) 25 MG tablet, Take 1 tablet (25 mg total) by mouth 2 (two) times daily., Disp: 30 tablet, Rfl: 1  EXAM:  GENERAL: alert, oriented, appears well and in no acute distress  HEENT: atraumatic, conjunttiva clear, no obvious abnormalities on inspection of external nose and ears  NECK: normal movements of the head and neck  LUNGS: on inspection no signs of respiratory distress, breathing rate appears normal, no obvious gross SOB, gasping or wheezing  CV: no obvious cyanosis  MS: moves all visible extremities without noticeable abnormality  PSYCH/NEURO: pleasant and cooperative, no obvious depression or anxiety, speech and thought processing grossly intact  ASSESSMENT AND PLAN:  Discussed the following assessment and plan:  Difficulty with weight loss, weight loss counseling-patient will continue Vyvanse as this helps keep her appetite and check.  Long discussion with patient regards to other medications we can trial to add on to help with weight loss.  Also encourage patient to look back remember where she started, of over 190 pounds and now she is at 165.  Advised patient that slow weight loss with good nutrition, good physical activity and good water intake is the route we want to go because this is sustainable for the body to be able to then keep off the weight.  Any sort of extreme crash diet that allows quick weight loss of 10 pounds in 2 weeks often is unhealthy and is not sustainable weight loss.  Patient verbalizes understanding, just has some frustrations.  She will stop Contrave and we will trial low-dose Topamax at bedtime to see if this helps assist in weight loss journey.  She will continue nutritious diet with good protein, various fruits and vegetables and good water intake as well as regular physical activity.   I  discussed the assessment and treatment plan with the patient. The patient was provided an opportunity to ask questions and all were answered. The patient agreed with the plan and demonstrated an understanding of the instructions.   The patient was advised to call back or seek an in-person evaluation if the symptoms worsen or if the condition fails to improve as anticipated.  I provided 25 minutes of video-face-to-face time during this encounter.   Tracey Harries, FNP

## 2019-04-15 ENCOUNTER — Encounter: Payer: Self-pay | Admitting: Family Medicine

## 2019-09-03 ENCOUNTER — Encounter: Payer: Self-pay | Admitting: Nurse Practitioner

## 2019-09-10 ENCOUNTER — Ambulatory Visit: Payer: Medicaid Other | Admitting: Obstetrics and Gynecology

## 2019-10-08 ENCOUNTER — Ambulatory Visit: Payer: Medicaid Other | Admitting: Obstetrics and Gynecology

## 2019-10-14 ENCOUNTER — Other Ambulatory Visit (HOSPITAL_COMMUNITY)
Admission: RE | Admit: 2019-10-14 | Discharge: 2019-10-14 | Disposition: A | Discharge status: Home / Self Care | Payer: Medicaid Other | Source: Ambulatory Visit | Attending: Advanced Practice Midwife | Admitting: Advanced Practice Midwife

## 2019-10-14 ENCOUNTER — Ambulatory Visit (INDEPENDENT_AMBULATORY_CARE_PROVIDER_SITE_OTHER): Payer: Medicaid Other | Admitting: Advanced Practice Midwife

## 2019-10-14 ENCOUNTER — Other Ambulatory Visit: Payer: Self-pay

## 2019-10-14 ENCOUNTER — Encounter: Payer: Self-pay | Admitting: Advanced Practice Midwife

## 2019-10-14 VITALS — BP 139/83 | HR 106 | Ht 62.0 in | Wt 188.0 lb

## 2019-10-14 DIAGNOSIS — Z0000 Encounter for general adult medical examination without abnormal findings: Secondary | ICD-10-CM

## 2019-10-14 DIAGNOSIS — Z124 Encounter for screening for malignant neoplasm of cervix: Secondary | ICD-10-CM

## 2019-10-14 DIAGNOSIS — R5383 Other fatigue: Secondary | ICD-10-CM | POA: Diagnosis not present

## 2019-10-14 DIAGNOSIS — R635 Abnormal weight gain: Secondary | ICD-10-CM | POA: Diagnosis not present

## 2019-10-14 DIAGNOSIS — Z01419 Encounter for gynecological examination (general) (routine) without abnormal findings: Secondary | ICD-10-CM | POA: Diagnosis not present

## 2019-10-14 NOTE — Progress Notes (Signed)
Gynecology Annual Exam   PCP: Jodelle Green, FNP  Chief Complaint:  Chief Complaint  Patient presents with  . Gynecologic Exam    IUD removal today    History of Present Illness: Patient is a 34 y.o. G2P2002 presents for annual exam. The patient has no gyn complaints today. She would like to have her Mirena IUD removed- it was placed after the birth of her son in 2017. She and her husband use vasectomy for birth control. She wonders if the IUD is having an impact on her ability to lose weight. She has concerns for possible PCOS. Her symptoms include weight gain despite healthy diet and regular vigorous exercise, feeling cold, feeling fatigued and hair loss. She has not had any periods while using the IUD.   LMP: No LMP recorded. (Menstrual status: IUD).  Intermenstrual Bleeding: not applicable Postcoital Bleeding: no Dysmenorrhea: not applicable  The patient is sexually active. She currently uses vasectomy for contraception. She denies dyspareunia.  The patient does perform self breast exams.  There is no notable family history of breast or ovarian cancer in her family.  The patient wears seatbelts: yes.   The patient has regular exercise: she works out 1 hour daily at [employer] and is active with her child. She eats a carb modified diet and primary beverage is water. She admits about 6-8 hours of sleep per night.    The patient denies current symptoms of depression.    Review of Systems: Review of Systems  Constitutional: Negative for chills and fever.       Positive for fatigue  HENT: Negative for congestion, ear discharge, ear pain, hearing loss, sinus pain and sore throat.   Eyes: Negative for blurred vision and double vision.  Respiratory: Negative for cough, shortness of breath and wheezing.   Cardiovascular: Negative for chest pain, palpitations and leg swelling.  Gastrointestinal: Negative for abdominal pain, blood in stool, constipation, diarrhea, heartburn, melena,  nausea and vomiting.  Genitourinary: Negative for dysuria, flank pain, frequency, hematuria and urgency.  Musculoskeletal: Negative for back pain, joint pain and myalgias.  Skin: Negative for itching and rash.  Neurological: Negative for dizziness, tingling, tremors, sensory change, speech change, focal weakness, seizures, loss of consciousness, weakness and headaches.  Endo/Heme/Allergies: Negative for environmental allergies. Does not bruise/bleed easily.       Positive for weight gain, hair loss, feeling cold  Psychiatric/Behavioral: Negative for depression, hallucinations, memory loss, substance abuse and suicidal ideas. The patient is not nervous/anxious and does not have insomnia.     Past Medical History:  Patient Active Problem List   Diagnosis Date Noted  . Obesity (BMI 30.0-34.9) 04/02/2018  . Weight loss counseling, encounter for 04/02/2018  . Unable to lose weight 03/05/2018  . 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 Acute gastric ulcer 03/05/2018    Improved as of 9.18.19   . Generalized abdominal pain 03/05/2018  . Other fatigue 09/03/2017  . Shortness of breath on exertion 09/03/2017  . Atrial tachycardia ([place]) 09/03/2017  . Tachycardia 12/13/2015  . Current use of beta blocker 09/08/2015  . pregnancy 08/11/2015    Pt followed by [facility] delivery due to maternal cardiac issues  S/p SVD x 1    . H/O sinus tachycardia 08/11/2015    Followed by Dr Dot Lanes in [facility]  S/p ablation x 2 - he is considering pacemaker  On metoprolol XR 50-100 chronically  Baseline HR 100-120  No syncope     .  daughter with Hyper IgE syndrome Aug 25, 2015    Pt's 34 yo was diagnosed with very elevated levels of IgE - she is followed at [facility] in [place] - extensive food intolerance and dermatologic problems - [facility] told her this fetus is also at risk    . Family history of myocardial infarction at age less than 64 08/25/15    Multiple relatives with MI history at a young  age Several expired , uncle required transplant at [facility] counseling done     . Family history of congenital heart defect 08-25-2015    Nephew died in neonatal period  Consider fetal echo     Past Surgical History:  Past Surgical History:  Procedure Laterality Date  . ATRIAL ABLATION SURGERY  2009/2012  . BONE GRAFT HIP ILIAC CREST Left 2000  . heart ablation  2009 2012    Gynecologic History:  No LMP recorded. (Menstrual status: IUD). Contraception: vasectomy Last Pap: 4 years ago Results were: no abnormalities   Obstetric History: [id number]  Family History:  Family History  Problem Relation Age of Onset  . Diabetes Father   . Hyperlipidemia Father   . Hypertension Father   . Heart disease Father   . Sudden death Father   . Sleep apnea Father   . Obesity Father   . Diabetes Mother   . Hypertension Mother   . Hyperlipidemia Mother   . Thyroid disease Mother   . Sleep apnea Mother   . Obesity Mother   . Asthma Mother   . COPD Mother   . Asthma Brother   . Depression Brother   . Diabetes Brother   . Hyperlipidemia Brother   . Hypertension Brother   . Asthma Maternal Grandmother   . Diabetes Maternal Grandmother   . Kidney disease Maternal Grandmother   . Hyperlipidemia Maternal Grandfather   . Hypertension Maternal Grandfather   . Kidney disease Maternal Grandfather   . Diabetes Paternal Grandmother   . Diabetes Paternal Grandfather     Social History:  Social History   Socioeconomic History  . Marital status: Married    Spouse name: [name] Vessel  . Number of children: 2  . Years of education: Not on file  . Highest education level: Not on file  Occupational History  . Occupation: Stay at home mom  Tobacco Use  . Smoking status: Never Smoker  . Smokeless tobacco: Never Used  Substance and Sexual Activity  . Alcohol use: Yes  . Drug use: No  . Sexual activity: Yes    Birth control/protection: None  Other Topics Concern  . Not on file    Social History Narrative  . Not on file   Social Determinants of Health   Financial Resource Strain:   . Difficulty of Paying Living Expenses:   Food Insecurity:   . Worried About Programme researcher, broadcasting/film/video in the Last Year:   . Barista in the Last Year:   Transportation Needs:   . Freight forwarder (Medical):   Marland Kitchen Lack of Transportation (Non-Medical):   Physical Activity:   . Days of Exercise per Week:   . Minutes of Exercise per Session:   Stress:   . Feeling of Stress :   Social Connections:   . Frequency of Communication with Friends and Family:   . Frequency of Social Gatherings with Friends and Family:   . Attends Religious Services:   . Active Member of Clubs or Organizations:   . Attends Club or  Organization Meetings:   Marland Kitchen Marital Status:   Intimate Partner Violence:   . Fear of Current or Ex-Partner:   . Emotionally Abused:   Marland Kitchen Physically Abused:   . Sexually Abused:     Allergies:  Allergies  Allergen Reactions  . Demerol [Meperidine] Hives and Swelling    Medications: Prior to Admission medications   Medication Sig Start Date End Date Taking? Authorizing Provider  Vitamin D, Ergocalciferol, (DRISDOL) 1.25 MG (50000 UT) CAPS capsule TAKE 1 CAPSULE BY MOUTH EVERY 7 DAYS 07/24/18  Yes Guse, Janna Arch, FNP    Physical Exam Vitals: Blood pressure 139/83, pulse (!) 106, height 5\' 2"  (1.575 m), weight 188 lb (85.3 kg)  General: NAD HEENT: normocephalic, anicteric Thyroid: no enlargement, no palpable nodules Pulmonary: No increased work of breathing, CTAB Cardiovascular: RRR, distal pulses 2+ Breast: Breast symmetrical, no tenderness, no palpable nodules or masses, no skin or nipple retraction present, no nipple discharge.  No axillary or supraclavicular lymphadenopathy. Abdomen: NABS, soft, non-tender, non-distended.  Umbilicus without lesions.  No hepatomegaly, splenomegaly or masses palpable. No evidence of hernia  Genitourinary:  External: Normal  external female genitalia.  Normal urethral meatus, normal Bartholin's and Skene's glands.    Vagina: Normal vaginal mucosa, no evidence of prolapse.    Cervix: Grossly normal in appearance, friable with PAP, IUD strings not visualized and unable to remove IUD with hook  Uterus: Non-enlarged, mobile, normal contour.  No CMT  Adnexa: ovaries non-enlarged, no adnexal masses  Rectal: deferred  Lymphatic: no evidence of inguinal lymphadenopathy Extremities: no edema, erythema, or tenderness Neurologic: Grossly intact Psychiatric: mood appropriate, affect full   Assessment: 34 y.o. G2P2002 routine annual exam  Plan: Problem List Items Addressed This Visit    None    Visit Diagnoses    Well woman exam with routine gynecological exam    -  Primary   Relevant Orders   TSH+Prl+FSH+TestT+LH+DHEA S...   Cytology - PAP   Weight gain       Relevant Orders   TSH+Prl+FSH+TestT+LH+DHEA S...   Fatigue, unspecified type       Relevant Orders   TSH+Prl+FSH+TestT+LH+DHEA S...   Cervical cancer screening       Relevant Orders   Cytology - PAP      1) STI screening  was offered and declined  2)  ASCCP guidelines and rationale discussed.  Patient opts for every 3-5 years screening interval. PAP today  3) Contraception - the patient is currently using  vasectomy.  She is happy with her current form of contraception and plans to continue  4) Routine healthcare maintenance including cholesterol, diabetes screening discussed Declines. PCOS panel ordered  5) Return in about 1 week (around 10/21/2019) for gyn u/s to check for IUD and follow up with MD for IUD removal planning.   12/21/2019, CNM [facility] 10/14/2019, 1:25 PM

## 2019-10-14 NOTE — Patient Instructions (Signed)
Diet for Polycystic Ovary Syndrome Polycystic ovary syndrome (PCOS) is a disorder of the chemicals (hormones) that regulate a woman's reproductive system, including monthly periods (menstruation). The condition causes important hormones to be out of balance. PCOS can:  Stop your periods or make them irregular.  Cause cysts to develop on your ovaries.  Make it difficult to get pregnant.  Stop your body from responding to the effects of insulin (insulin resistance). Insulin resistance can lead to obesity and diabetes. Changing what you eat can help you manage PCOS and improve your health. Following a balanced diet can help you lose weight and improve the way that your body uses insulin. What are tips for following this plan?  Follow a balanced diet for meals and snacks. Eat breakfast, lunch, dinner, and one or two snacks every day.  Include protein in each meal and snack.  Choose whole grains instead of products that are made with refined flour.  Eat a variety of foods.  Exercise regularly as told by your health care provider. Aim to do 30 or more minutes of exercise on most days of the week.  If you are overweight or obese: ? Pay attention to how many calories you eat. Cutting down on calories can help you lose weight. ? Work with your health care provider or a diet and nutrition specialist (dietitian) to figure out how many calories you need each day. What foods can I eat?  Fruits Include a variety of colors and types. All fruits are helpful for PCOS. Vegetables Include a variety of colors and types. All vegetables are helpful for PCOS. Grains Whole grains, such as whole wheat. Whole-grain breads, crackers, cereals, and pasta. Unsweetened oatmeal, bulgur, barley, quinoa, and brown rice. Tortillas made from corn or whole-wheat flour. Meats and other proteins Low-fat (lean) proteins, such as fish, chicken, beans, eggs, and tofu. Dairy Low-fat dairy products, such as skim milk,  cheese sticks, and yogurt. Beverages Low-fat or fat-free drinks, such as water, low-fat milk, sugar-free drinks, and small amounts of 100% fruit juice. Seasonings and condiments Ketchup. Mustard. Barbecue sauce. Relish. Low-fat or fat-free mayonnaise. Fats and oils Olive oil or canola oil. Walnuts and almonds. The items listed above may not be a complete list of recommended foods and beverages. Contact a dietitian for more options. What foods are not recommended? Foods that are high in calories or fat. Fried foods. Sweets. Products that are made from refined white flour, including white bread, pastries, white rice, and pasta. The items listed above may not be a complete list of foods and beverages to avoid. Contact a dietitian for more information. Summary  PCOS is a hormonal imbalance that affects a woman's reproductive system.  You can help to manage your PCOS by exercising regularly and eating a healthy, varied diet of vegetables, fruit, whole grains, low-fat (lean) protein, and low-fat dairy products.  Changing what you eat can improve the way that your body uses insulin, help your hormones reach normal levels, and help you lose weight. This information is not intended to replace advice given to you by your health care provider. Make sure you discuss any questions you have with your health care provider. Document Revised: 09/24/2018 Document Reviewed: 04/08/2017 Elsevier Patient Education  2020 Elsevier Inc. Polycystic Ovarian Syndrome  Polycystic ovarian syndrome (PCOS) is a common hormonal disorder among women of reproductive age. In most women with PCOS, many small fluid-filled sacs (cysts) grow on the ovaries, and the cysts are not part of a normal menstrual cycle.   PCOS can cause problems with your menstrual periods and make it difficult to get pregnant. It can also cause an increased risk of miscarriage with pregnancy. If it is not treated, PCOS can lead to serious health problems,  such as diabetes and heart disease. What are the causes? The cause of PCOS is not known, but it may be the result of a combination of certain factors, such as:  Irregular menstrual cycle.  High levels of certain hormones (androgens).  Problems with the hormone that helps to control blood sugar (insulin resistance).  Certain genes. What increases the risk? This condition is more likely to develop in women who have a family history of PCOS. What are the signs or symptoms? Symptoms of PCOS may include:  Multiple ovarian cysts.  Infrequent periods or no periods.  Periods that are too frequent or too heavy.  Unpredictable periods.  Inability to get pregnant (infertility) because of not ovulating.  Increased growth of hair on the face, chest, stomach, back, thumbs, thighs, or toes.  Acne or oily skin. Acne may develop during adulthood, and it may not respond to treatment.  Pelvic pain.  Weight gain or obesity.  Patches of thickened and dark brown or black skin on the neck, arms, breasts, or thighs (acanthosis nigricans).  Excess hair growth on the face, chest, abdomen, or upper thighs (hirsutism). How is this diagnosed? This condition is diagnosed based on:  Your medical history.  A physical exam, including a pelvic exam. Your health care provider may look for areas of increased hair growth on your skin.  Tests, such as: ? Ultrasound. This may be used to examine the ovaries and the lining of the uterus (endometrium) for cysts. ? Blood tests. These may be used to check levels of sugar (glucose), female hormone (testosterone), and female hormones (estrogen and progesterone) in your blood. How is this treated? There is no cure for PCOS, but treatment can help to manage symptoms and prevent more health problems from developing. Treatment varies depending on:  Your symptoms.  Whether you want to have a baby or whether you need birth control (contraception). Treatment may  include nutrition and lifestyle changes along with:  Progesterone hormone to start a menstrual period.  Birth control pills to help you have regular menstrual periods.  Medicines to make you ovulate, if you want to get pregnant.  Medicine to reduce excessive hair growth.  Surgery, in severe cases. This may involve making small holes in one or both of your ovaries. This decreases the amount of testosterone that your body produces. Follow these instructions at home:  Take over-the-counter and prescription medicines only as told by your health care provider.  Follow a healthy meal plan. This can help you reduce the effects of PCOS. ? Eat a healthy diet that includes lean proteins, complex carbohydrates, fresh fruits and vegetables, low-fat dairy products, and healthy fats. Make sure to eat enough fiber.  If you are overweight, lose weight as told by your health care provider. ? Losing 10% of your body weight may improve symptoms. ? Your health care provider can determine how much weight loss is best for you and can help you lose weight safely.  Keep all follow-up visits as told by your health care provider. This is important. Contact a health care provider if:  Your symptoms do not get better with medicine.  You develop new symptoms. This information is not intended to replace advice given to you by your health care provider. Make sure you   discuss any questions you have with your health care provider. Document Revised: 05/17/2017 Document Reviewed: 11/20/2015 Elsevier Patient Education  2020 Elsevier Inc.  

## 2019-10-15 LAB — CYTOLOGY - PAP
Comment: NEGATIVE
Diagnosis: NEGATIVE
High risk HPV: NEGATIVE

## 2019-10-21 LAB — TSH+PRL+FSH+TESTT+LH+DHEA S...
17-Hydroxyprogesterone: 54 ng/dL
Androstenedione: 68 ng/dL (ref 41–262)
DHEA-SO4: 231 ug/dL (ref 84.8–378.0)
FSH: 2.2 m[IU]/mL
LH: 5.6 m[IU]/mL
Prolactin: 9 ng/mL (ref 4.8–23.3)
TSH: 0.932 u[IU]/mL (ref 0.450–4.500)
Testosterone, Free: 1.3 pg/mL (ref 0.0–4.2)
Testosterone: 15 ng/dL (ref 8–48)

## 2019-10-26 ENCOUNTER — Ambulatory Visit (INDEPENDENT_AMBULATORY_CARE_PROVIDER_SITE_OTHER): Payer: Medicaid Other

## 2019-10-26 ENCOUNTER — Other Ambulatory Visit: Payer: Self-pay | Admitting: Obstetrics & Gynecology

## 2019-10-26 ENCOUNTER — Encounter: Payer: Self-pay | Admitting: Obstetrics & Gynecology

## 2019-10-26 ENCOUNTER — Ambulatory Visit (INDEPENDENT_AMBULATORY_CARE_PROVIDER_SITE_OTHER): Payer: Medicaid Other | Admitting: Obstetrics & Gynecology

## 2019-10-26 ENCOUNTER — Other Ambulatory Visit: Payer: Self-pay

## 2019-10-26 VITALS — BP 120/80 | Ht 62.0 in | Wt 190.0 lb

## 2019-10-26 DIAGNOSIS — Z30432 Encounter for removal of intrauterine contraceptive device: Secondary | ICD-10-CM

## 2019-10-26 DIAGNOSIS — R635 Abnormal weight gain: Secondary | ICD-10-CM

## 2019-10-26 DIAGNOSIS — T8332XA Displacement of intrauterine contraceptive device, initial encounter: Secondary | ICD-10-CM

## 2019-10-26 DIAGNOSIS — Z30431 Encounter for routine checking of intrauterine contraceptive device: Secondary | ICD-10-CM

## 2019-10-26 DIAGNOSIS — T8332XD Displacement of intrauterine contraceptive device, subsequent encounter: Secondary | ICD-10-CM

## 2019-10-26 NOTE — Progress Notes (Signed)
HPI: Pt has concerns over current IUD, since 2017 when placed after delivery, she has had difficulty losing weight.  No periods w IUD.  Husband has had vasectomy, so no need for contraception.  She would like it out, but strings not visible on last exam.  Has tried Contrave for weight loss in the past without help.  Follows good diet and exercise practices  Ultrasound demonstrates no masses seen, IUD in correct orientation.  PMHx: She  has a past medical history of Atrial tachycardia (HCC) and Atrial tachycardia (HCC) (34 years old). Also,  has a past surgical history that includes Bone graft hip iliac crest (Left, 2000); Atrial ablation surgery (2009/2012); and heart ablation (2009 2012)., family history includes Asthma in her brother, maternal grandmother, and mother; COPD in her mother; Depression in her brother; Diabetes in her brother, father, maternal grandmother, mother, paternal grandfather, and paternal grandmother; Heart disease in her father; Hyperlipidemia in her brother, father, maternal grandfather, and mother; Hypertension in her brother, father, maternal grandfather, and mother; Kidney disease in her maternal grandfather and maternal grandmother; Obesity in her father and mother; Sleep apnea in her father and mother; Sudden death in her father; Thyroid disease in her mother.,  reports that she has never smoked. She has never used smokeless tobacco. She reports current alcohol use. She reports that she does not use drugs.  She has a current medication list which includes the following prescription(s): levonorgestrel and vitamin d (ergocalciferol). Also, is allergic to demerol [meperidine].  Review of Systems  All other systems reviewed and are negative.   Objective: BP 120/80   Ht 5\' 2"  (1.575 m)   Wt 190 lb (86.2 kg)   BMI 34.75 kg/m   Physical examination Constitutional NAD, Conversant  Skin No rashes, lesions or ulceration.   Extremities: Moves all appropriately.  Normal  ROM for age. No lymphadenopathy.  Neuro: Grossly intact  Psych: Oriented to PPT.  Normal mood. Normal affect.   PELVIS TRANSVAGINAL NON-OB (TV ONLY)  Result Date: 10/26/2019 Patient Name: Alison Myers DOB: 1985-07-08 MRN: [id number] ULTRASOUND REPORT Location: [facility] Date of Service: 10/26/2019 Indications: IUD check Findings: The uterus is anteverted and measures 9.5 x 5.0 x 4.2 cm. Echo texture is homogenous without evidence of focal masses. The Endometrium measures 3.5 mm. The IUD is correctly placed within the uterus. Right Ovary measures 3.2 x 1.8 x 1.9 cm. It is normal in appearance. Left Ovary measures 3.1 x 3.2 x 2.2 cm. It is normal in appearance. Survey of the adnexa demonstrates no adnexal masses. There is no free fluid in the cul de sac. Impression: 1. Normal pelvic ultrasound. 2. The IUD is correctly placed within the uterus. Recommendations: 1.Clinical correlation with the patient's History and Physical Exam. 12/26/2019, RT Review of ULTRASOUND.    I have personally reviewed images and report of recent ultrasound done at [facility].    Plan of management to be discussed with patient. [facility], MD, FACOG [facility], [facility] 10/26/2019  3:58 PM   Assessment:  Encounter for IUD removal Malpositioned intrauterine device (IUD), subsequent encounter Weight gain    Options for weight loss discussed, including future consideration for meds    BMI 34 today, goal is to get below 27    Plan diet and exercise, and monitor after effects of removing the hormonal IUD  A total of 20 minutes were spent face-to-face with the patient as well as preparation, review, communication, and documentation during this encounter.  IUD Removal Pelvic exam:  Two IUD strings absent seen coming from the cervical os. EGBUS, vaginal vault and cervix: within normal limits Strings of IUD  Grasped from within the cervical canal.  IUD removed without problem.  Pt tolerated this  well.  IUD noted to be intact. Assessment: IUD Removal Plan: IUD removed and plan for contraception is vasectomy. She was amenable to this plan.  Barnett Applebaum, MD, [facility], [facility] 10/26/2019  4:08 PM

## 2019-10-27 ENCOUNTER — Other Ambulatory Visit: Payer: Self-pay | Admitting: Advanced Practice Midwife

## 2019-10-27 DIAGNOSIS — T8332XA Displacement of intrauterine contraceptive device, initial encounter: Secondary | ICD-10-CM

## 2019-10-27 NOTE — Progress Notes (Unsigned)
Gyn u/s ordered to check for IUD placement.

## 2019-11-19 ENCOUNTER — Other Ambulatory Visit: Payer: Self-pay | Admitting: Obstetrics & Gynecology

## 2019-11-19 MED ORDER — PHENTERMINE HCL 37.5 MG PO TABS
ORAL_TABLET | ORAL | 0 refills | Status: DC
Start: 2019-11-19 — End: 2019-12-18

## 2019-11-19 NOTE — Telephone Encounter (Signed)
Patient is schedule for 12/18/19 with [facility]

## 2019-12-18 ENCOUNTER — Ambulatory Visit (INDEPENDENT_AMBULATORY_CARE_PROVIDER_SITE_OTHER): Payer: Medicaid Other | Admitting: Obstetrics & Gynecology

## 2019-12-18 ENCOUNTER — Encounter: Payer: Self-pay | Admitting: Obstetrics & Gynecology

## 2019-12-18 ENCOUNTER — Other Ambulatory Visit: Payer: Self-pay

## 2019-12-18 VITALS — BP 120/80 | Ht 62.0 in | Wt 187.0 lb

## 2019-12-18 DIAGNOSIS — E669 Obesity, unspecified: Secondary | ICD-10-CM

## 2019-12-18 MED ORDER — PHENTERMINE HCL 37.5 MG PO TABS: ORAL_TABLET | ORAL | 1 refills | Status: DC

## 2019-12-18 NOTE — Progress Notes (Signed)
  History of Present Illness:  Alison Myers is a 34 y.o. who was started on Phentermine approximately 4 weeks ago due to obesity/abnormal weight gain. The patient has lost 3 pounds over the past month due to meds..   She has these side effects: none.  PMHx: She  has a past medical history of Atrial tachycardia (HCC) and Atrial tachycardia (HCC) (34 years old). Also,  has a past surgical history that includes Bone graft hip iliac crest (Left, 2000); Atrial ablation surgery (2009/2012); and heart ablation (2009 2012)., family history includes Asthma in her brother, maternal grandmother, and mother; COPD in her mother; Depression in her brother; Diabetes in her brother, father, maternal grandmother, mother, paternal grandfather, and paternal grandmother; Heart disease in her father; Hyperlipidemia in her brother, father, maternal grandfather, and mother; Hypertension in her brother, father, maternal grandfather, and mother; Kidney disease in her maternal grandfather and maternal grandmother; Obesity in her father and mother; Sleep apnea in her father and mother; Sudden death in her father; Thyroid disease in her mother.,  reports that she has never smoked. She has never used smokeless tobacco. She reports current alcohol use. She reports that she does not use drugs.  She has a current medication list which includes the following prescription(s): vitamin d (ergocalciferol) and phentermine. Also, is allergic to demerol [meperidine].  Review of Systems  All other systems reviewed and are negative.   Physical Exam:  BP 120/80   Ht 5\' 2"  (1.575 m)   Wt 187 lb (84.8 kg)   LMP 12/17/2019   BMI 34.20 kg/m  Body mass index is 34.2 kg/m. Filed Weights   12/18/19 1026  Weight: 187 lb (84.8 kg)    Physical Exam Constitutional:      General: She is not in acute distress.    Appearance: She is well-developed.  Musculoskeletal:        General: Normal range of motion.  Neurological:     Mental Status: She  is alert and oriented to person, place, and time.  Skin:    General: Skin is warm and dry.  Vitals reviewed.     Assessment: obesity Medication treatment is going adequately for her.  Plan: Patient is continued/added to prescription appetite suppressants: Phentermine, self-directed dieting and exercise storngly encouraged.   Will continue to assist patient in incorporating positive experiences into her life to promote a positive mental attitude.  Education given regarding appropriate lifestyle changes for weight loss, including regular physical activity, healthy coping strategies, caloric restriction, and healthy eating patterns.  The risks and benefits as well as side effects of medication, such as Phenteramine or Tenuate, is discussed.  The pros and cons of suppressing appetite and boosting metabolism is counseled.  Risks of tolerance and addiction discussed.  Use of medicine will be short term.  Pt to call with any negative side effects and agrees to keep follow up appointments.   F/u 2 mos  A total of 20 minutes were spent face-to-face with the patient as well as preparation, review, communication, and documentation during this encounter.   02/18/20, MD, Annamarie Major Ob/Gyn, [facility] 12/18/2019  10:54 AM

## 2019-12-25 ENCOUNTER — Other Ambulatory Visit: Payer: Self-pay | Admitting: Obstetrics & Gynecology

## 2019-12-25 MED ORDER — PHENTERMINE HCL 37.5 MG PO CAPS
37.5000 mg | ORAL_CAPSULE | ORAL | 1 refills | Status: DC
Start: 2019-12-25 — End: 2020-02-17

## 2019-12-27 ENCOUNTER — Other Ambulatory Visit: Payer: Self-pay

## 2019-12-27 ENCOUNTER — Encounter (HOSPITAL_COMMUNITY): Payer: Self-pay | Admitting: Emergency Medicine

## 2019-12-27 ENCOUNTER — Emergency Department (HOSPITAL_COMMUNITY)
Admission: EM | Admit: 2019-12-27 | Discharge: 2019-12-28 | Disposition: A | Discharge status: Home / Self Care | Payer: 59 | Attending: Emergency Medicine | Admitting: Emergency Medicine

## 2019-12-27 ENCOUNTER — Emergency Department (HOSPITAL_COMMUNITY): Payer: 59

## 2019-12-27 DIAGNOSIS — M79662 Pain in left lower leg: Secondary | ICD-10-CM | POA: Insufficient documentation

## 2019-12-27 DIAGNOSIS — S86912A Strain of unspecified muscle(s) and tendon(s) at lower leg level, left leg, initial encounter: Secondary | ICD-10-CM

## 2019-12-27 DIAGNOSIS — Z79899 Other long term (current) drug therapy: Secondary | ICD-10-CM | POA: Insufficient documentation

## 2019-12-27 LAB — I-STAT BETA HCG BLOOD, ED (MC, WL, AP ONLY): I-stat hCG, quantitative: <5 m[IU]/mL (ref <5)

## 2019-12-27 NOTE — ED Triage Notes (Addendum)
Pt presents to ED POv. Pt c/o L leg pain. Pt reports running downstairs and hearing a snap and feeling as if she was hit in the back ot the leg. Pt had fx and bone graft as child in same leg. Unable to bear weight. Denies injury or trauma

## 2019-12-28 MED ORDER — ACETAMINOPHEN 500 MG PO TABS
1000.0000 mg | ORAL_TABLET | Freq: Once | ORAL | Status: AC
  Administered 2019-12-28: 1000 mg via ORAL
  Filled 2019-12-28: qty 2

## 2019-12-28 NOTE — ED Notes (Signed)
Pt given CAM boot & crutches

## 2019-12-28 NOTE — ED Notes (Signed)
Patient verbalizes understanding of discharge instructions. Opportunity for questioning and answers were provided. Armband removed by staff, pt discharged from ED stable & ambulatory  

## 2019-12-28 NOTE — ED Notes (Addendum)
POC to ambulate pt with CAM boot. If unsuccessful, VRBO for crutches

## 2019-12-28 NOTE — ED Provider Notes (Signed)
[facility] EMERGENCY DEPARTMENT Provider Note  CSN: [id number] Arrival date & time: 12/27/19 2033  Chief Complaint(s) Leg Pain  HPI Alison Myers is a 34 y.o. female   CC: left calf pain  Onset/Duration: sudden, 8 hrs ago Timing: constant Location: left calf Quality: aching Severity: severe Modifying Factors:  Improved by: immobility  Worsened by: movement, ambulation, palpation Associated Signs/Symptoms:  Pertinent (+): mild swelling  Pertinent (-): fall, redness, wound Context: Patient reports running down a set of stairs chasing after her dog, when she heard and felt a snap in her left calf.  She felt immediate pain at that time.  She denied any fall or trauma related to the incident.   HPI  Past Medical History Past Medical History:  Diagnosis Date  . Atrial tachycardia (HCC)   . Atrial tachycardia (HCC) 34 years old   Patient Active Problem List   Diagnosis Date Noted  . Obesity (BMI 30.0-34.9) 04/02/2018  . Weight loss counseling, encounter for 04/02/2018  . Unable to lose weight 03/05/2018  . 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 Acute gastric ulcer 03/05/2018  . Generalized abdominal pain 03/05/2018  . Other fatigue 09/03/2017  . Shortness of breath on exertion 09/03/2017  . Atrial tachycardia (HCC) 09/03/2017  . Tachycardia 12/13/2015  . Current use of beta blocker 09/08/2015  . pregnancy 08/11/2015  . H/O sinus tachycardia 08/11/2015  . daughter with Hyper IgE syndrome 08/11/2015  . Family history of myocardial infarction at age less than 68 08/11/2015  . Family history of congenital heart defect 08/11/2015   Home Medication(s) Prior to Admission medications   Medication Sig Start Date End Date Taking? Authorizing Provider  phentermine 37.5 MG capsule Take 1 capsule (37.5 mg total) by mouth every morning. 12/25/19   Nadara Mustard, MD  Vitamin D, Ergocalciferol, (DRISDOL) 1.25 MG (50000 UT) CAPS capsule TAKE 1 CAPSULE BY MOUTH EVERY 7  DAYS 07/24/18   Tracey Harries, FNP                                                                                                                                    Past Surgical History Past Surgical History:  Procedure Laterality Date  . ATRIAL ABLATION SURGERY  2009/2012  . BONE GRAFT HIP ILIAC CREST Left 2000  . heart ablation  2009 2012   Family History Family History  Problem Relation Age of Onset  . Diabetes Father   . Hyperlipidemia Father   . Hypertension Father   . Heart disease Father   . Sudden death Father   . Sleep apnea Father   . Obesity Father   . Diabetes Mother   . Hypertension Mother   . Hyperlipidemia Mother   . Thyroid disease Mother   . Sleep apnea Mother   . Obesity Mother   . Asthma Mother   . COPD Mother   . Asthma Brother   . Depression Brother   .  Diabetes Brother   . Hyperlipidemia Brother   . Hypertension Brother   . Asthma Maternal Grandmother   . Diabetes Maternal Grandmother   . Kidney disease Maternal Grandmother   . Hyperlipidemia Maternal Grandfather   . Hypertension Maternal Grandfather   . Kidney disease Maternal Grandfather   . Diabetes Paternal Grandmother   . Diabetes Paternal Grandfather     Social History Social History   Tobacco Use  . Smoking status: Never Smoker  . Smokeless tobacco: Never Used  Vaping Use  . Vaping Use: Never used  Substance Use Topics  . Alcohol use: Yes  . Drug use: No   Allergies Demerol [meperidine]  Review of Systems Review of Systems All other systems are reviewed and are negative for acute change except as noted in the HPI  Physical Exam Vital Signs  I have reviewed the triage vital signs BP (!) 128/92 (BP Location: Left Arm)   Pulse (!) 101   Temp 98.5 F (36.9 C) (Oral)   Resp 18   Ht 5\' 2"  (1.575 m)   Wt 81.6 kg   LMP 12/17/2019   SpO2 100%   BMI 32.92 kg/m   Physical Exam Vitals reviewed.  Constitutional:      General: She is not in acute distress.    Appearance:  She is well-developed. She is not diaphoretic.  HENT:     Head: Normocephalic and atraumatic.     Right Ear: External ear normal.     Left Ear: External ear normal.     Nose: Nose normal.  Eyes:     General: No scleral icterus.    Conjunctiva/sclera: Conjunctivae normal.  Neck:     Trachea: Phonation normal.  Cardiovascular:     Rate and Rhythm: Normal rate and regular rhythm.  Pulmonary:     Effort: Pulmonary effort is normal. No respiratory distress.     Breath sounds: No stridor.  Abdominal:     General: There is no distension.  Musculoskeletal:     Cervical back: Normal range of motion.     Left lower leg: Tenderness present.     Left ankle: Decreased range of motion.     Left Achilles Tendon: No tenderness.       Legs:  Neurological:     Mental Status: She is alert and oriented to person, place, and time.  Psychiatric:        Behavior: Behavior normal.     ED Results and Treatments Labs (all labs ordered are listed, but only abnormal results are displayed) Labs Reviewed  I-STAT BETA HCG BLOOD, ED ([facility], [facility], [facility] ONLY)                                                                                                                         EKG  EKG Interpretation  Date/Time:    Ventricular Rate:    PR Interval:    QRS Duration:   QT Interval:    QTC Calculation:  R Axis:     Text Interpretation:        Radiology DG Tibia/Fibula Left  Result Date: 12/27/2019 CLINICAL DATA:  34 year old female with trauma to the left ankle. EXAM: LEFT TIBIA AND FIBULA - 2 VIEW; LEFT ANKLE COMPLETE - 3+ VIEW COMPARISON:  Multiple prior left ankle radiograph dating back to 12/10/2012. FINDINGS: There is no acute fracture or dislocation. The bones are well mineralized. No arthritic changes. The ankle mortise is intact. 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 The soft tissues are unremarkable. IMPRESSION: 1. No acute fracture or dislocation. 2. Stable lesion of the distal tibia. Electronically Signed   By: Elgie Collard M.D.   On: 12/27/2019 21:25   DG Ankle Complete Left  Result Date: 12/27/2019 CLINICAL DATA:  34 year old female with trauma to the left ankle. EXAM: LEFT TIBIA AND FIBULA - 2 VIEW; LEFT ANKLE COMPLETE - 3+ VIEW COMPARISON:  Multiple prior left ankle radiograph dating back to 12/10/2012. FINDINGS: There is no acute fracture or dislocation. The bones are well mineralized. No arthritic changes. The ankle mortise is intact. 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 The soft tissues are unremarkable. IMPRESSION: 1. No acute fracture or dislocation. 2. Stable lesion of the distal tibia. Electronically Signed   By: Elgie Collard M.D.   On: 12/27/2019 21:25    Pertinent labs & imaging results that were available during my care of the patient were reviewed by me and considered in my medical decision making (see chart for details).  Medications Ordered in ED Medications  acetaminophen (TYLENOL) tablet 1,000 mg (1,000 mg Oral Given 12/28/19 0330)                                                                                                                                    Procedures Ultrasound ED Soft Tissue  Date/Time: 12/28/2019 3:25 AM Performed by: Nira Conn, MD Authorized by: Nira Conn, MD   Procedure details:    Indications: limb pain     Longitudinal view:  Visualized   Images: archived   Location:    Location: lower extremity     Side:  Left Comments:     Achilles tendon intact Patient has hypoechoic fluid in the midcalf musculature and associated tendon concerning for muscular/tendon  tear.    (including critical care time)  Medical Decision Making / ED Course I have reviewed the nursing notes for this encounter and the patient's prior records (if available in EHR or on provided paperwork).   Angiela Courtois was evaluated in Emergency Department on 12/28/2019 for the symptoms described in the history of present illness. She was  evaluated in the context of the global COVID-19 pandemic, which necessitated consideration that the patient might be at risk for infection with the SARS-CoV-2 virus that causes COVID-19. Institutional protocols and algorithms that pertain to the evaluation of patients at risk for COVID-19 are in a state of rapid change based on information released by regulatory bodies including the CDC and federal and state organizations. These policies and algorithms were followed during the patient's care in the ED.  Left calf pain due to muscle/tendon tear (non achilles). Possible soleus, but cannot confirm here. Will provide patient with CAM walker and refer to Ortho/Sports medicine.       Final Clinical Impression(s) / ED Diagnoses Final diagnoses:  Tear of tendon of left lower extremity, initial encounter   The patient appears reasonably screened and/or stabilized for discharge and I doubt any other medical condition or other [facility] requiring further screening, evaluation, or treatment in the ED at this time prior to discharge. Safe for discharge with strict return precautions.  Disposition: Discharge  Condition: Good  I have discussed the results, Dx and Tx plan with the patient/family who expressed understanding and agree(s) with the plan. Discharge instructions discussed at length. The patient/family was given strict return precautions who verbalized understanding of the instructions. No further questions at time of discharge.    ED Discharge Orders    None      Follow Up: Kathryne Hitch, MD [address] [place]  [address] [phone]  Call  to schedule follow with Orthopedic Surgeon in 1-2 weeks  [place], Azucena Fallen, MD [address] [place] [address] [phone]   to schedule follow with Sports Medicine in 1-2 weeks      This chart was dictated using voice recognition software.  Despite best efforts to proofread,  errors can occur which can change the documentation meaning.   Nira Conn, MD 12/28/19 [phone]

## 2019-12-28 NOTE — Progress Notes (Signed)
Orthopedic Tech Progress Note Patient Details:  Alison Myers 02-Aug-1985 [id number]  Patient ID: Alison Myers, female   DOB: 1985-08-06, 34 y.o.   MRN: [id number] Assisted rn with pt.  [name] 12/28/2019, 4:40 AM

## 2019-12-28 NOTE — ED Notes (Signed)
Paged ortho for CAM boot

## 2019-12-30 ENCOUNTER — Encounter: Payer: Self-pay | Admitting: Family Medicine

## 2019-12-30 ENCOUNTER — Telehealth: Payer: Self-pay | Admitting: *Deleted

## 2019-12-30 ENCOUNTER — Ambulatory Visit: Payer: Self-pay

## 2019-12-30 ENCOUNTER — Ambulatory Visit (INDEPENDENT_AMBULATORY_CARE_PROVIDER_SITE_OTHER): Payer: 59 | Admitting: Family Medicine

## 2019-12-30 ENCOUNTER — Other Ambulatory Visit: Payer: Self-pay

## 2019-12-30 DIAGNOSIS — M79662 Pain in left lower leg: Secondary | ICD-10-CM

## 2019-12-30 MED ORDER — TRAMADOL HCL 50 MG PO TABS: 50.0000 mg | ORAL_TABLET | Freq: Four times a day (QID) | ORAL | 0 refills | Status: DC | PRN

## 2019-12-30 MED ORDER — MELOXICAM 15 MG PO TABS: 7.5000 mg | ORAL_TABLET | Freq: Every day | ORAL | 6 refills | Status: DC | PRN

## 2019-12-30 NOTE — Progress Notes (Signed)
Office Visit Note   Patient: Alison Myers           Date of Birth: 1986-03-01           MRN: [id number] Visit Date: 12/30/2019 Requested by: Tracey Harries, FNP No address on file PCP: Tracey Harries, FNP  Subjective: Chief Complaint  Patient presents with   Left Lower Leg - Pain    DOI 12/27/19 Felt pop in the calf while running down steps, chasing after her dog. Was seen at [facility] ED and placed in a cam boot. NWB with crutches.    HPI: She is here with left calf pain.  3 days ago she thought her dog was going to run into the street, she jumped down the steps and felt something pop in her calf.  Immediate severe pain, unable to bear full weight.  She went to the ER where ultrasound revealed possible muscular injury.  She was placed in a fracture boot and now presents for evaluation.  No previous problems with her calf.  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               ROS:   All other systems were reviewed and are negative.  Objective: Vital Signs: LMP 12/17/2019   Physical Exam:  General:  Alert and oriented, in no acute distress. Pulm:  Breathing unlabored. Psy:  Normal mood, congruent affect. Skin: There is some bruising on the posterior medial calf. Left leg: She is very tender to palpation along the medial gastrocnemius muscle.  No tenderness around the Achilles tendon.  She is able to actively plantarflex against resistance with intact tendon function.  She is neurovascularly intact.  Imaging: US Guided Needle Placement - No Linked Charges  Result Date: 12/30/2019 Limited diagnostic ultrasound of left calf reveals a partial separation of the gastrocnemius muscle from the fascia between it and the soleus muscle.  The majority of the muscle is intact.  Achilles looks normal.   Assessment & Plan: 1.  Left gastrocnemius strain with grade 2 separation from the fascia. -We will refer her to physical therapy.  Home exercises given as well.  Crutches for the next  few days until pain allows her to bear weight in her fracture boot. -She will take the boot off multiple times daily to work on range of motion to prevent stiffness.  She will also do very gentle plantar flexion exercises to prevent blood clot. -She will wean from her fracture boot over the next 2 to 3 weeks.  If she fails to improve, she will come back in for recheck.  Otherwise I will see her back as needed as long as she make steady progress.     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   Herpes simplex virus type 2 (HSV-2) infection affecting pregnancy in third trimester, antepartum 11/25/2015   Current use of beta blocker 09/08/2015   pregnancy 08/11/2015   H/O sinus tachycardia 08/11/2015   daughter with Hyper IgE  syndrome 08/11/2015   Family history of myocardial infarction at age less than 48 08/11/2015   Family history of congenital heart defect 08/11/2015   Chest pain 07/18/2007   Other specified cardiac arrhythmias 04/30/2007   Palpitations 04/29/2007   Past Medical History:  Diagnosis Date   Atrial tachycardia (HCC)    Atrial tachycardia (HCC) 34 years old    Family History  Problem Relation Age of Onset   Diabetes Father    Hyperlipidemia Father    Hypertension Father    Heart disease Father    Sudden death Father    Sleep apnea Father    Obesity Father    Diabetes Mother    Hypertension Mother    Hyperlipidemia Mother    Thyroid  disease Mother    Sleep apnea Mother    Obesity Mother    Asthma Mother    COPD Mother    Asthma Brother    Depression Brother    Diabetes Brother    Hyperlipidemia Brother    Hypertension Brother    Asthma Maternal Grandmother    Diabetes Maternal Grandmother    Kidney disease Maternal Grandmother    Hyperlipidemia Maternal Grandfather    Hypertension Maternal Grandfather    Kidney disease Maternal Grandfather    Diabetes Paternal Grandmother    Diabetes Paternal Grandfather     Past Surgical History:  Procedure Laterality Date   ATRIAL ABLATION SURGERY  2009/2012   BONE GRAFT HIP ILIAC CREST Left 2000   heart ablation  2009 2012   Social History   Occupational History   Occupation: Stay at home mom  Tobacco Use   Smoking status: Never Smoker   Smokeless tobacco: Never Used  Building services engineer Use: Never used  Substance and Sexual Activity   Alcohol use: Yes   Drug use: No   Sexual activity: Yes    Birth control/protection: None

## 2019-12-30 NOTE — Telephone Encounter (Signed)
Attempted to contact pt to complete transition of care assessment; left message on voicemail.  Katrice Terita Hejl, RN, BSN, CCRN [facility] [phone]  

## 2020-02-17 ENCOUNTER — Encounter: Payer: Self-pay | Admitting: Obstetrics & Gynecology

## 2020-02-17 ENCOUNTER — Other Ambulatory Visit: Payer: Self-pay

## 2020-02-17 ENCOUNTER — Telehealth (INDEPENDENT_AMBULATORY_CARE_PROVIDER_SITE_OTHER): Payer: Medicaid Other | Admitting: Obstetrics & Gynecology

## 2020-02-17 VITALS — Wt 183.0 lb

## 2020-02-17 DIAGNOSIS — E669 Obesity, unspecified: Secondary | ICD-10-CM | POA: Diagnosis not present

## 2020-02-17 MED ORDER — PHENTERMINE HCL 37.5 MG PO CAPS: 37.5000 mg | ORAL_CAPSULE | ORAL | 0 refills | Status: DC

## 2020-02-17 MED ORDER — TOPIRAMATE 25 MG PO TABS: 25.0000 mg | ORAL_TABLET | Freq: Every day | ORAL | 6 refills | Status: DC

## 2020-02-17 NOTE — Progress Notes (Signed)
Virtual Visit via Video Note  I connected with Alison Myers on 02/17/20 at  1:50 PM EDT by a video enabled telemedicine application and verified that I am speaking with the correct person using two identifiers.  Location: Patient: Home Provider: Office   I discussed the limitations of evaluation and management by telemedicine and the availability of in person appointments. The patient expressed understanding and agreed to proceed.  History of Present Illness: Alison Myers is a 34 y.o. who was started on Phentermine approximately 3 months ago due to obesity/abnormal weight gain. The patient has lost 3 pounds over the past 2 mos due to meds; less exercise due to leg injuries..   She has these side effects: none.  PMHx: She  has a past medical history of Atrial tachycardia (HCC) and Atrial tachycardia (HCC) (34 years old). Also,  has a past surgical history that includes Bone graft hip iliac crest (Left, 2000); Atrial ablation surgery (2009/2012); and heart ablation (2009 2012)., family history includes Asthma in her brother, maternal grandmother, and mother; COPD in her mother; Depression in her brother; Diabetes in her brother, father, maternal grandmother, mother, paternal grandfather, and paternal grandmother; Heart disease in her father; Hyperlipidemia in her brother, father, maternal grandfather, and mother; Hypertension in her brother, father, maternal grandfather, and mother; Kidney disease in her maternal grandfather and maternal grandmother; Obesity in her father and mother; Sleep apnea in her father and mother; Sudden death in her father; Thyroid disease in her mother.,  reports that she has never smoked. She has never used smokeless tobacco. She reports current alcohol use. She reports that she does not use drugs.  She has a current medication list which includes the following prescription(s): acetaminophen, meloxicam, phentermine, topiramate, tramadol, and vitamin d (ergocalciferol). Also, is  allergic to demerol [meperidine].  Review of Systems  All other systems reviewed and are negative.     Observations/Objective: No exam today, due to telephone eVisit due to [facility] virus restriction on elective visits and procedures.  Prior visits reviewed along with ultrasounds/labs as indicated. From Home: Wt 183 lb (83 kg)   BMI 33.47 kg/m  Body mass index is 33.47 kg/m.  Filed Weights   02/17/20 1401  Weight: 183 lb (83 kg)    Assessment and Plan:   ICD-10-CM   1. Obesity (BMI 30.0-34.9)  E66.9 topiramate (TOPAMAX) 25 MG tablet    phentermine 37.5 MG capsule   Assessment: obesity Medication treatment is going marginally for her.  Plan: Patient is continued/added to prescription appetite suppressants: Phentermine and also added Topamax; consider change to Tenuate if no further response.   Will continue to assist patient in incorporating positive experiences into her life to promote a positive mental attitude.  Education given regarding appropriate lifestyle changes for weight loss, including regular physical activity, healthy coping strategies, caloric restriction, and healthy eating patterns.  The risks and benefits as well as side effects of medication, such as Phenteramine or Tenuate, is discussed.  The pros and cons of suppressing appetite and boosting metabolism is counseled.  Risks of tolerance and addiction discussed.  Use of medicine will be short term.  Pt to call with any negative side effects and agrees to keep follow up appointments.  Follow Up Instructions: 1 month   I discussed the assessment and treatment plan with the patient. The patient was provided an opportunity to ask questions and all were answered. The patient agreed with the plan and demonstrated an understanding of the instructions.   The patient  was advised to call back or seek an in-person evaluation if the symptoms worsen or if the condition fails to improve as anticipated.  A total of 20 minutes  were spent face-to-face with the patient as well as preparation, review, communication, and documentation during this encounter.   Annamarie Major, MD, Merlinda Frederick Ob/Gyn, [facility] 02/17/2020  2:01 PM

## 2020-03-21 ENCOUNTER — Encounter: Payer: Self-pay | Admitting: Obstetrics & Gynecology

## 2020-03-21 ENCOUNTER — Ambulatory Visit (INDEPENDENT_AMBULATORY_CARE_PROVIDER_SITE_OTHER): Payer: Medicaid Other | Admitting: Obstetrics & Gynecology

## 2020-03-21 ENCOUNTER — Other Ambulatory Visit: Payer: Self-pay

## 2020-03-21 VITALS — BP 120/80 | Ht 62.0 in | Wt 187.0 lb

## 2020-03-21 DIAGNOSIS — E669 Obesity, unspecified: Secondary | ICD-10-CM

## 2020-03-21 MED ORDER — TOPIRAMATE 25 MG PO TABS: 25.0000 mg | ORAL_TABLET | Freq: Two times a day (BID) | ORAL | 6 refills | Status: DC

## 2020-03-21 MED ORDER — DIETHYLPROPION HCL ER 75 MG PO TB24: 1.0000 | ORAL_TABLET | Freq: Every day | ORAL | 0 refills | Status: DC

## 2020-03-21 NOTE — Progress Notes (Signed)
°  History of Present Illness:  Alison Myers is a 34 y.o. who was started on  Phentermine w added Topamax once daily approximately 1 month ago due to obesity/abnormal weight gain. The patient reports no significant weight change..   She has these side effects: dry mouth.  PMHx: She  has a past medical history of Atrial tachycardia (HCC) and Atrial tachycardia (HCC) (34 years old). Also,  has a past surgical history that includes Bone graft hip iliac crest (Left, 2000); Atrial ablation surgery (2009/2012); and heart ablation (2009 2012)., family history includes Asthma in her brother, maternal grandmother, and mother; COPD in her mother; Depression in her brother; Diabetes in her brother, father, maternal grandmother, mother, paternal grandfather, and paternal grandmother; Heart disease in her father; Hyperlipidemia in her brother, father, maternal grandfather, and mother; Hypertension in her brother, father, maternal grandfather, and mother; Kidney disease in her maternal grandfather and maternal grandmother; Obesity in her father and mother; Sleep apnea in her father and mother; Sudden death in her father; Thyroid disease in her mother.,  reports that she has never smoked. She has never used smokeless tobacco. She reports current alcohol use. She reports that she does not use drugs.  She has a current medication list which includes the following prescription(s): acetaminophen, meloxicam, topiramate, tramadol, vitamin d (ergocalciferol), and diethylpropion hcl cr. Also, is allergic to demerol [meperidine].  Review of Systems  All other systems reviewed and are negative.   Physical Exam:  BP 120/80    Ht 5\' 2"  (1.575 m)    Wt 187 lb (84.8 kg)    LMP 03/13/2020    BMI 34.20 kg/m  Body mass index is 34.2 kg/m. Filed Weights   03/21/20 1055  Weight: 187 lb (84.8 kg)    Physical Exam Constitutional:      General: She is not in acute distress.    Appearance: She is well-developed.  Musculoskeletal:         General: Normal range of motion.  Neurological:     Mental Status: She is alert and oriented to person, place, and time.  Skin:    General: Skin is warm and dry.  Vitals reviewed.     Assessment: obesity Medication treatment is going poorly for her.  Plan: Patient is changed to Tenuate and Topamax (now BID)  Will continue to assist patient in incorporating positive experiences into her life to promote a positive mental attitude.  Education given regarding appropriate lifestyle changes for weight loss, including regular physical activity, healthy coping strategies, caloric restriction, and healthy eating patterns.  The risks and benefits as well as side effects of medication, such as Phenteramine or Tenuate, is discussed.  The pros and cons of suppressing appetite and boosting metabolism is counseled.  Risks of tolerance and addiction discussed.  Use of medicine will be short term.  Pt to call with any negative side effects and agrees to keep follow up appointments.  F/u Virtual 1 month  A total of 20 minutes were spent face-to-face with the patient as well as preparation, review, communication, and documentation during this encounter.   05/21/20, MD, Annamarie Major Ob/Gyn, [facility] 03/21/2020  11:13 AM

## 2020-04-18 ENCOUNTER — Other Ambulatory Visit: Payer: Self-pay

## 2020-04-18 ENCOUNTER — Telehealth (INDEPENDENT_AMBULATORY_CARE_PROVIDER_SITE_OTHER): Payer: Medicaid Other | Admitting: Obstetrics & Gynecology

## 2020-04-18 ENCOUNTER — Encounter: Payer: Self-pay | Admitting: Obstetrics & Gynecology

## 2020-04-18 DIAGNOSIS — E669 Obesity, unspecified: Secondary | ICD-10-CM | POA: Diagnosis not present

## 2020-04-18 DIAGNOSIS — E66811 Obesity, class 1: Secondary | ICD-10-CM

## 2020-04-18 MED ORDER — DIETHYLPROPION HCL ER 75 MG PO TB24: 1.0000 | ORAL_TABLET | Freq: Every day | ORAL | 1 refills | Status: DC

## 2020-04-18 NOTE — Progress Notes (Signed)
Virtual Visit via Video Note  I connected with Alison Myers on 04/18/20 at 10:40 AM EDT by a video enabled telemedicine application and verified that I am speaking with the correct person using two identifiers.  Location: Patient: Home Provider: Office   I discussed the limitations of evaluation and management by telemedicine and the availability of in person appointments. The patient expressed understanding and agreed to proceed.  History of Present Illness: Alison Myers is a 34 y.o. who was continued on Tenuate and Topamax approximately 4 weeks ago due to obesity/abnormal weight gain. The patient has lost 5 pounds over the past month due to meds and lifestyle changes..   She has these side effects: dry mouth.  PMHx: She  has a past medical history of Atrial tachycardia (HCC) and Atrial tachycardia (HCC) (34 years old). Also,  has a past surgical history that includes Bone graft hip iliac crest (Left, 2000); Atrial ablation surgery (2009/2012); and heart ablation (2009 2012)., family history includes Asthma in her brother, maternal grandmother, and mother; COPD in her mother; Depression in her brother; Diabetes in her brother, father, maternal grandmother, mother, paternal grandfather, and paternal grandmother; Heart disease in her father; Hyperlipidemia in her brother, father, maternal grandfather, and mother; Hypertension in her brother, father, maternal grandfather, and mother; Kidney disease in her maternal grandfather and maternal grandmother; Obesity in her father and mother; Sleep apnea in her father and mother; Sudden death in her father; Thyroid disease in her mother.,  reports that she has never smoked. She has never used smokeless tobacco. She reports current alcohol use. She reports that she does not use drugs.  She has a current medication list which includes the following prescription(s): acetaminophen, diethylpropion hcl cr, meloxicam, topiramate, tramadol, and vitamin d (ergocalciferol).  Also, is allergic to demerol [meperidine].  Review of Systems  All other systems reviewed and are negative.     Observations/Objective: No exam today, due to telephone eVisit due to [facility] virus restriction on elective visits and procedures.  Prior visits reviewed along with ultrasounds/labs as indicated. From Home: There were no vitals taken for this visit. There is no height or weight on file to calculate BMI. There were no vitals filed for this visit. Home weight 178 lbs  Assessment and Plan:   ICD-10-CM   1. Obesity (BMI 30.0-34.9)  E66.9    Assessment: obesity Medication treatment is going well for her.  Plan: Patient is continued/added to prescription appetite suppressants: Tenuate and Topmax.   Will continue to assist patient in incorporating positive experiences into her life to promote a positive mental attitude.  Education given regarding appropriate lifestyle changes for weight loss, including regular physical activity, healthy coping strategies, caloric restriction, and healthy eating patterns.  The risks and benefits as well as side effects of medication, such as Phenteramine or Tenuate, is discussed.  The pros and cons of suppressing appetite and boosting metabolism is counseled.  Risks of tolerance and addiction discussed.  Use of medicine will be short term.  Pt to call with any negative side effects and agrees to keep follow up appointments.  Patient doing well with weight loss in progress. Will stop medicine after 2 MORE MONTHS and allow for a drug-free holiday. Patient understands she may need future therapy if weight gain resumes or she does not reach her weight loss goals on her own with good diet and exercise habits.  Follow Up Instructions: Annual due in April 2022   I discussed the assessment and treatment plan with  the patient. The patient was provided an opportunity to ask questions and all were answered. The patient agreed with the plan and demonstrated an  understanding of the instructions.   The patient was advised to call back or seek an in-person evaluation if the symptoms worsen or if the condition fails to improve as anticipated.  A total of 15 minutes were spent face-to-face (Virtual) with the patient as well as preparation, review, communication, and documentation during this encounter.   Annamarie Major, MD, Merlinda Frederick [facility]/Gyn, [facility] 04/18/2020  10:55 AM

## 2020-04-19 ENCOUNTER — Telehealth: Payer: Self-pay

## 2020-04-19 NOTE — Telephone Encounter (Signed)
Pt medication Diethylpropion  is not covered by her insurance. Can you send in a different RX?

## 2020-04-20 NOTE — Telephone Encounter (Signed)
This was a refill from last month, was it not covered then?  Does she have new insurance? It is generic, so not sure if alternative generic Phentermine would be any different, but we can possibly try this based on her answers to the above q's.

## 2020-04-20 NOTE — Telephone Encounter (Signed)
Pt states she used a coupon card last month and it was only 20 dollars

## 2021-03-03 ENCOUNTER — Telehealth: Payer: Self-pay

## 2021-03-03 NOTE — Telephone Encounter (Signed)
Patient is scheduled for 03/27/21 with R[facility] at 11 am

## 2021-03-08 NOTE — Telephone Encounter (Signed)
Noted. Will order to arrive by apt date/time. 

## 2021-03-27 ENCOUNTER — Encounter: Payer: Self-pay | Admitting: Obstetrics & Gynecology

## 2021-03-27 ENCOUNTER — Other Ambulatory Visit: Payer: Self-pay

## 2021-03-27 ENCOUNTER — Ambulatory Visit (INDEPENDENT_AMBULATORY_CARE_PROVIDER_SITE_OTHER): Payer: 59 | Admitting: Obstetrics & Gynecology

## 2021-03-27 VITALS — BP 120/80 | Ht 62.0 in | Wt 202.0 lb

## 2021-03-27 DIAGNOSIS — E669 Obesity, unspecified: Secondary | ICD-10-CM

## 2021-03-27 DIAGNOSIS — Z30430 Encounter for insertion of intrauterine contraceptive device: Secondary | ICD-10-CM | POA: Diagnosis not present

## 2021-03-27 MED ORDER — DIETHYLPROPION HCL ER 75 MG PO TB24: 1.0000 | ORAL_TABLET | Freq: Every day | ORAL | 0 refills | Status: DC

## 2021-03-27 NOTE — Progress Notes (Signed)
  IUD PROCEDURE NOTE:  Alison Myers is a 35 y.o. [id number] here for IUD insertion. No GYN concerns.  Last pap smear was normal.  IUD Insertion Procedure Note Patient identified, informed consent performed, consent signed.   Discussed risks of irregular bleeding, cramping, infection, malpositioning or misplacement of the IUD outside the uterus which may require further procedure such as laparoscopy, risk of failure <1%. Time out was performed.  Urine pregnancy test negative.  A bimanual exam showed the uterus to be midposition.  Speculum placed in the vagina.  Cervix visualized.  Cleaned with Betadine x 2.  Grasped anteriorly with a single tooth tenaculum.  Uterus sounded to 8 cm.   Mirena IUD placed per manufacturer's recommendations.  Strings trimmed to 3 cm. Tenaculum was removed, good hemostasis noted.  Patient tolerated procedure well.   Patient was given post-procedure instructions.  She was advised to have backup contraception for one week.  Patient was also asked to check IUD strings periodically and follow up in 4 weeks for IUD check.  Annamarie Major, MD, [facility] Frederick [facility]/G[facility], [facility] 03/27/2021  11:22 AM

## 2021-03-27 NOTE — Patient Instructions (Signed)
Intrauterine Device Insertion, Care After This sheet gives you information about how to care for yourself after your procedure. Your health care provider may also give you more specific instructions. If you have problems or questions, contact your health care provider. What can I expect after the procedure? After the procedure, it is common to have: Cramps and pain in the abdomen. Bleeding. It may be light or heavy. This may last for a few days. Lower back pain. Dizziness. Headaches. Nausea. Follow these instructions at home:  Before resuming sexual activity, check to make sure that you can feel the IUD string or strings. You should be able to feel the end of the string below the opening of your cervix. If your IUD string is in place, you may resume sexual activity. If you had a hormonal IUD inserted more than 7 days after your most recent period started, you will need to use a backup method of birth control for 7 days after IUD insertion. Ask your health care provider whether this applies to you. Continue to check that the IUD is still in place by feeling for the strings after every menstrual period, or once a month. An IUD will not protect you from sexually transmitted infections (STIs). Use methods to prevent the exchange of body fluids between partners (barrier protection) every time you have sex. Barrier protection can be used during oral, vaginal, or anal sex. Commonly used barrier methods include: Female condom. Female condom. Dental dam. Take over-the-counter and prescription medicines only as told by your health care provider. Keep all follow-up visits as told by your health care provider. This is important. Contact a health care provider if: You feel light-headed or weak. You have any of the following problems with your IUD string or strings: The string bothers or hurts you or your sexual partner. You cannot feel the string. The string has gotten longer. You can feel the IUD in  your vagina. You think you may be pregnant, or you miss your menstrual period. You think you may have a sexually transmitted infection (STI). Get help right away if: You have flu-like symptoms, such as tiredness (fatigue) and muscle aches. You have a fever and chills. You have bleeding that is heavier or lasts longer than a normal menstrual cycle. You have abnormal or bad-smelling discharge from your vagina. You develop abdominal pain that is new, is getting worse, or is not in the same area of earlier cramping and pain. You have pain during sexual activity. Summary After the procedure, it is common to have cramps and pain in the abdomen. It is also common to have light bleeding or heavier bleeding that is like your menstrual period. Continue to check that the IUD is still in place by feeling for the strings after every menstrual period, or once a month. Keep all follow-up visits as told by your health care provider. This is important. Contact your health care provider if you have problems with your IUD strings, such as the string getting longer or bothering you or your sexual partner. This information is not intended to replace advice given to you by your health care provider. Make sure you discuss any questions you have with your health care provider. Document Revised: 05/26/2019 Document Reviewed: 05/26/2019 Elsevier Patient Education  2022 Elsevier Inc.  

## 2021-03-27 NOTE — Progress Notes (Signed)
History of Present Illness:  Alison Myers is a 35 y.o. who was started on meds in the past for weight loss, none recently.  She also had IUD removed in hopes hormone changes would help w weight loss; despite this she has continued to gain weight.  20 lbs over the last year.  Periods are worse and irregular.  Exercises regularly, although job has changed to more sedentary.  Fatigue present.  PMHx: She  has a past medical history of Atrial tachycardia (HCC) and Atrial tachycardia (HCC) (35 years old). Also,  has a past surgical history that includes Bone graft hip iliac crest (Left, 2000); Atrial ablation surgery (2009/2012); and heart ablation (2009 2012)., family history includes Asthma in her brother, maternal grandmother, and mother; COPD in her mother; Depression in her brother; Diabetes in her brother, father, maternal grandmother, mother, paternal grandfather, and paternal grandmother; Heart disease in her father; Hyperlipidemia in her brother, father, maternal grandfather, and mother; Hypertension in her brother, father, maternal grandfather, and mother; Kidney disease in her maternal grandfather and maternal grandmother; Obesity in her father and mother; Sleep apnea in her father and mother; Sudden death in her father; Thyroid disease in her mother.,  reports that she has never smoked. She has never used smokeless tobacco. She reports current alcohol use. She reports that she does not use drugs.  She has a current medication list which includes the following prescription(s): acetaminophen, diethylpropion hcl cr, meloxicam, topiramate, tramadol, and vitamin d (ergocalciferol). Also, is allergic to demerol [meperidine].  Review of Systems  All other systems reviewed and are negative.  Physical Exam:  BP 120/80   Ht 5\' 2"  (1.575 m)   Wt 202 lb (91.6 kg)   LMP 02/22/2021   BMI 36.95 kg/m  Body mass index is 36.95 kg/m. Filed Weights   03/27/21 1055  Weight: 202 lb (91.6 kg)    Physical  Exam Constitutional:      General: She is not in acute distress.    Appearance: She is well-developed.  Genitourinary:     Right Labia: No rash or tenderness.    Left Labia: No tenderness or rash.    No vaginal erythema or bleeding.      Right Adnexa: not tender and no mass present.    Left Adnexa: not tender and no mass present.    No cervical motion tenderness, discharge, polyp or nabothian cyst.     Uterus is not enlarged.     No uterine mass detected.    Pelvic exam was performed with patient in the lithotomy position.  HENT:     Head: Normocephalic and atraumatic.     Nose: Nose normal.  Abdominal:     General: There is no distension.     Palpations: Abdomen is soft.     Tenderness: There is no abdominal tenderness.  Musculoskeletal:        General: Normal range of motion.  Neurological:     Mental Status: She is alert and oriented to person, place, and time.     Cranial Nerves: No cranial nerve deficit.  Skin:    General: Skin is warm and dry.  Psychiatric:        Attention and Perception: Attention normal.        Mood and Affect: Mood and affect normal.        Speech: Speech normal.        Behavior: Behavior normal.        Thought Content: Thought content normal.  Judgment: Judgment normal.    Assessment: obesity  Plan: Patient is continued/added to  Tenuate.  Labs today .  Consideration for Ozempic.   Will continue to assist patient in incorporating positive experiences into her life to promote a positive mental attitude.  Education given regarding appropriate lifestyle changes for weight loss, including regular physical activity, healthy coping strategies, caloric restriction, and healthy eating patterns.  The risks and benefits as well as side effects of medication, such as Phenteramine or Tenuate, is discussed.  The pros and cons of suppressing appetite and boosting metabolism is counseled.  Risks of tolerance and addiction discussed.  Use of medicine  will be short term.  Pt to call with any negative side effects and agrees to keep follow up appointments.  A total of 22 minutes were spent face-to-face with the patient as well as preparation, review, communication, and documentation during this encounter.   Annamarie Major, MD, [facility]/G[facility], [facility] 03/27/2021  11:18 AM

## 2021-03-28 LAB — HEMOGLOBIN A1C
Est. average glucose Bld gHb Est-mCnc: 114 mg/dL
Hgb A1c MFr Bld: 5.6 % (ref 4.8–5.6)

## 2021-03-28 LAB — TSH: TSH: 1.29 u[IU]/mL (ref 0.450–4.500)

## 2021-04-06 NOTE — Telephone Encounter (Signed)
Mirena rcvd/charged 03/27/21

## 2021-04-25 ENCOUNTER — Other Ambulatory Visit: Payer: Self-pay

## 2021-04-25 ENCOUNTER — Encounter: Payer: Self-pay | Admitting: Obstetrics & Gynecology

## 2021-04-25 ENCOUNTER — Ambulatory Visit (INDEPENDENT_AMBULATORY_CARE_PROVIDER_SITE_OTHER): Payer: 59 | Admitting: Obstetrics & Gynecology

## 2021-04-25 VITALS — BP 120/80 | Ht 62.0 in | Wt 199.0 lb

## 2021-04-25 DIAGNOSIS — Z30431 Encounter for routine checking of intrauterine contraceptive device: Secondary | ICD-10-CM

## 2021-04-25 DIAGNOSIS — E669 Obesity, unspecified: Secondary | ICD-10-CM | POA: Diagnosis not present

## 2021-04-25 MED ORDER — TOPIRAMATE 25 MG PO TABS: 25.0000 mg | ORAL_TABLET | Freq: Two times a day (BID) | ORAL | 6 refills | Status: DC

## 2021-04-25 MED ORDER — DIETHYLPROPION HCL ER 75 MG PO TB24: 1.0000 | ORAL_TABLET | Freq: Every day | ORAL | 1 refills | Status: DC

## 2021-04-25 NOTE — Progress Notes (Signed)
  History of Present Illness:  Alison Myers is a 35 y.o. that had a Mirena IUD placed approximately 4 weeks ago. Since that time, she states that she has had no pain.  One episode of bleeding.  Pt also has been taking Tenuate for weight loss (restarted after gaining 20lbs last year).  No side effects.  Has lost 3 labs during this time.  Has used this medicine in past w success.  PMHx: She  has a past medical history of Atrial tachycardia (HCC) and Atrial tachycardia (HCC) (35 years old). Also,  has a past surgical history that includes Bone graft hip iliac crest (Left, 2000); Atrial ablation surgery (2009/2012); and heart ablation (2009 2012)., family history includes Asthma in her brother, maternal grandmother, and mother; COPD in her mother; Depression in her brother; Diabetes in her brother, father, maternal grandmother, mother, paternal grandfather, and paternal grandmother; Heart disease in her father; Hyperlipidemia in her brother, father, maternal grandfather, and mother; Hypertension in her brother, father, maternal grandfather, and mother; Kidney disease in her maternal grandfather and maternal grandmother; Obesity in her father and mother; Sleep apnea in her father and mother; Sudden death in her father; Thyroid disease in her mother.,  reports that she has never smoked. She has never used smokeless tobacco. She reports current alcohol use. She reports that she does not use drugs. Current Meds  Medication Sig   acetaminophen (TYLENOL) 500 MG tablet Take 500 mg by mouth every 6 (six) hours as needed.  .  Also, is allergic to demerol [meperidine]..  Review of Systems  All other systems reviewed and are negative.  Physical Exam:  BP 120/80   Ht 5\' 2"  (1.575 m)   Wt 199 lb (90.3 kg)   LMP 04/18/2021   BMI 36.40 kg/m  Body mass index is 36.4 kg/m. Constitutional: Well nourished, well developed female in no acute distress.  Abdomen: diffusely non tender to palpation, non distended, and no  masses, hernias Neuro: Grossly intact Psych:  Normal mood and affect.    Pelvic exam:  Two IUD strings present seen coming from the cervical os. EGBUS, vaginal vault and cervix: within normal limits  Assessment:    ICD-10-CM   1. IUD check up  Z30.431     2. Obesity (BMI 30.0-34.9)  E66.9 topiramate (TOPAMAX) 25 MG tablet     IUD strings present in proper location; pt doing well  Plan: She was told to continue to use barrier contraception, in order to prevent any STIs, and to take a home pregnancy test or call [phone] if she ever thinks she may be pregnant, and that her IUD expires in 8 years.  She also will continue Tenuate and also add Topamax twice daily to aid in her weight loss.  F/U 2 mos (tele) for weight loss visit  A total of 20 minutes were spent face-to-face with the patient as well as preparation, review, communication, and documentation during this encounter.   [place], MD, Annamarie Major Ob/Gyn, [facility] 04/25/2021  2:00 PM

## 2021-06-30 ENCOUNTER — Encounter: Payer: Self-pay | Admitting: Physician Assistant

## 2021-06-30 ENCOUNTER — Ambulatory Visit: Payer: 59 | Admitting: Physician Assistant

## 2021-06-30 DIAGNOSIS — M5440 Lumbago with sciatica, unspecified side: Secondary | ICD-10-CM

## 2021-06-30 MED ORDER — PREDNISONE 5 MG (21) PO TBPK: ORAL_TABLET | ORAL | 0 refills | Status: DC

## 2021-06-30 NOTE — Progress Notes (Signed)
Office Visit Note   Patient: Alison Myers           Date of Birth: 10-14-1985           MRN: [id number] Visit Date: 06/30/2021              Requested by: Tracey Harries, FNP No address on file PCP: Tracey Harries, FNP  Chief Complaint  Patient presents with   Lower Back - Pain      HPI: Patient is a pleasant 36 year old woman with a 5-day history of focal lower back pain in the middle of her lower back.  She denies any paresthesias any loss of bowel or bladder control any weakness.  She said she does not have any particular injury it started when she was getting out of bed.  No previous history.  She is just taken over-the-counter ibuprofen.  She did see her chiropractor who did work with her a couple times thought her hip height was uneven and that this may be a contributing factor  Assessment & Plan: Visit Diagnoses: No diagnosis found.  Plan: Lower back pain with some radiation into the right leg with straight leg raise.  We discussed the natural history of this.  Her x-rays do not show any acute injury or any advanced degenerative process.  I recommend we start with a Depo-Medrol Dosepak.  I have also given her some low back exercises to try.  I like her to do this on a regular basis.  If she gets worse or has not improved significantly she will follow-up in 2 weeks we could consider formal physical therapy and she said she is interested in getting an MRI if she does not improve I think that would be reasonable I did note on her chart that she has a history of an ulcer she denies this and obviously has never had any digestive issues ulcer issues or problems with oral anti-inflammatories although I did remind her she needs to take her medication with food Follow-Up Instructions: No follow-ups on file.   Ortho Exam  Patient is alert, oriented, no adenopathy, well-dressed, normal affect, normal respiratory effort. Patient is pleasant to exam sitting in a chair feels better leaning  forward just slightly.  She has tenderness over her mid back approximately the level of L3 but no step-off or abnormality she has relief of pain when she is slightly stooped forward accentuation of pain with leaning backwards.  Deep tendon reflexes may be slightly decreased on the right versus the last but otherwise intact she has 5 out of 5 strength with ankle dorsiflexion plantarflexion leg straightening flexion and hip flexion and extension.  Straight leg raise on the right slightly produces the pain in her  Imaging: No results found. No images are attached to the encounter.  Labs: Lab Results  Component Value Date   HGBA1C 5.6 03/27/2021   HGBA1C 5.5 03/05/2018   HGBA1C 5.3 09/03/2017     Lab Results  Component Value Date   ALBUMIN 4.4 04/30/2018   ALBUMIN 4.7 09/03/2017   ALBUMIN 4.2 07/25/2011    No results found for: MG Lab Results  Component Value Date   VD25OH 16.25 (L) 04/30/2018   VD25OH 19.7 (L) 09/03/2017    No results found for: PREALBUMIN CBC EXTENDED Latest Ref Rng & Units 04/30/2018 09/03/2017 08/04/2015  WBC 4.0 - 10.5 K/uL 6.2 7.8 9.0  RBC 3.87 - 5.11 Mil/uL 4.74 5.04 4.34  HGB 12.0 - 15.0 g/dL 96.2 22.9 12.3  HCT 36.0 - 46.0 % 40.2 42.3 35.6  PLT 150.0 - 400.0 K/uL 245.0 - 214  NEUTROABS 1.4 - 7.7 K/uL 3.7 5.2 -  LYMPHSABS 0.7 - 4.0 K/uL 1.8 1.8 -     There is no height or weight on file to calculate BMI.  Orders:  No orders of the defined types were placed in this encounter.  Meds ordered this encounter  Medications   predniSONE (STERAPRED UNI-PAK 21 TAB) 5 MG (21) TBPK tablet    Sig: TAKE AS DIRECTED    Dispense:  21 tablet    Refill:  0     Procedures: No procedures performed  Clinical Data: No additional findings.  ROS:  All other systems negative, except as noted in the HPI. Review of Systems  All other systems reviewed and are negative.  Objective: Vital Signs: There were no vitals taken for this visit.  Specialty Comments:   No specialty comments available.  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   Herpes simplex virus type 2 (HSV-2) infection affecting pregnancy in third trimester, antepartum 11/25/2015   Current use of beta blocker 09/08/2015   pregnancy 08/11/2015   H/O sinus tachycardia 08/11/2015   daughter with Hyper IgE syndrome 08/11/2015   Family history of myocardial infarction at age less than 23 08/11/2015   Family history of congenital heart defect 08/11/2015   Chest pain 07/18/2007   Other specified cardiac arrhythmias 04/30/2007   Palpitations 04/29/2007   Past Medical History:  Diagnosis Date   Atrial tachycardia (HCC)    Atrial tachycardia (HCC) 36 years old    Family History  Problem Relation Age of Onset   Diabetes Father    Hyperlipidemia Father    Hypertension Father    Heart disease Father    Sudden death Father    Sleep apnea Father    Obesity Father    Diabetes Mother    Hypertension Mother    Hyperlipidemia Mother    Thyroid disease Mother    Sleep apnea Mother    Obesity Mother    Asthma Mother    COPD Mother    Asthma Brother    Depression Brother    Diabetes Brother    Hyperlipidemia Brother    Hypertension Brother    Asthma Maternal Grandmother    Diabetes Maternal Grandmother    Kidney disease Maternal Grandmother    Hyperlipidemia Maternal Grandfather    Hypertension Maternal Grandfather    Kidney disease Maternal Grandfather    Diabetes Paternal Grandmother    Diabetes Paternal Grandfather     Past Surgical History:   Procedure Laterality Date   ATRIAL ABLATION SURGERY  2009/2012   BONE GRAFT HIP ILIAC CREST Left 2000   heart ablation  2009 2012   Social History   Occupational History   Occupation: Stay at home mom  Tobacco Use   Smoking status: Never   Smokeless tobacco: Never  Vaping Use   Vaping Use: Never used  Substance and Sexual Activity   Alcohol use: Yes   Drug use: No   Sexual activity: Yes  Birth control/protection: None

## 2021-07-14 ENCOUNTER — Ambulatory Visit: Payer: 59 | Admitting: Physician Assistant

## 2022-10-17 NOTE — Telephone Encounter (Signed)
Error

## 2023-06-10 ENCOUNTER — Encounter: Payer: Self-pay | Admitting: Allergy and Immunology

## 2023-06-10 ENCOUNTER — Ambulatory Visit: Payer: 59 | Admitting: Allergy and Immunology

## 2023-06-10 VITALS — BP 102/78 | HR 80 | Resp 16 | Ht 61.0 in | Wt 143.8 lb

## 2023-06-10 DIAGNOSIS — T7800XD Anaphylactic reaction due to unspecified food, subsequent encounter: Secondary | ICD-10-CM | POA: Diagnosis not present

## 2023-06-10 DIAGNOSIS — M7989 Other specified soft tissue disorders: Secondary | ICD-10-CM | POA: Diagnosis not present

## 2023-06-10 DIAGNOSIS — M25649 Stiffness of unspecified hand, not elsewhere classified: Secondary | ICD-10-CM | POA: Diagnosis not present

## 2023-06-10 DIAGNOSIS — T782XXD Anaphylactic shock, unspecified, subsequent encounter: Secondary | ICD-10-CM

## 2023-06-10 DIAGNOSIS — T63481D Toxic effect of venom of other arthropod, accidental (unintentional), subsequent encounter: Secondary | ICD-10-CM | POA: Diagnosis not present

## 2023-06-10 DIAGNOSIS — T7800XA Anaphylactic reaction due to unspecified food, initial encounter: Secondary | ICD-10-CM

## 2023-06-10 NOTE — Progress Notes (Unsigned)
[address] [place] - [place] - [place]   Dear [facility],  Thank you for referring Marvine Shipe to the [facility] [place] on 06/10/2023.   Below is a summation of this patient's evaluation and recommendations.  Thank you for your referral. I will keep you informed about this patient's response to treatment.   If you have any questions please do not hesitate to contact me.   Sincerely,  Jessica Priest, MD Allergy / Immunology [facility]   ______________________________________________________________________    NEW PATIENT NOTE  Referring Provider: [facility] Primary Provider: Ailene Ravel, MD Date of office visit: 06/10/2023    Subjective:   Chief Complaint:  Alison Myers (DOB: 1986-02-17) is a 37 y.o. female who presents to the clinic on 06/10/2023 with a chief complaint of Allergic Reaction and Angioedema .     HPI: Alison Myers presents to this clinic in evaluation of 3 main issues.  First, in early September 2024 she stepped in a mound of fire ants and within minutes developed diffuse urticaria along with lip swelling and throat swelling and trouble breathing for which she immediately went to the emergency room and was treated with 2 doses of epinephrine for documented angioedema of her oral pharynx and urticaria and within several hours her reaction completely resolved.  She does have an injectable epinephrine device available at this point in time.  Second, she states that sometime after Halloween she was eating at [employer] and she had banana with chocolate fondue, shrimp, lobster, chicken and within minutes developed very bad stomach cramps and some nasal congestion that lasted a few hours and was not associated with any other systemic or constitutional symptoms and did not require any therapy.  She has eaten chicken since that point in  time with no problem.  Third, she has noticed over the course of the past year that she is getting recurrent hand swelling and stiffness.  This appears to be an almost daily basis and will wax and wane throughout the day.  She has no other joints involved with swelling or pain.  She does not receive the flu vaccine.  Past Medical History:  Diagnosis Date   Atrial tachycardia (HCC)    Atrial tachycardia (HCC) 37 years old    Past Surgical History:  Procedure Laterality Date   ATRIAL ABLATION SURGERY  2009/2012   BONE GRAFT HIP ILIAC CREST Left 2000   heart ablation  2009 2012    Allergies as of 06/10/2023       Reactions   Demerol [meperidine] Hives, Swelling        Medication List    EPINEPHrine 0.3 mg/0.3 mL Soaj injection Commonly known as: EPI-PEN Inject into the muscle.   levonorgestrel 20 MCG/DAY Iud Commonly known as: MIRENA by Intrauterine route.   Wegovy 2.4 MG/0.75ML Soaj Generic drug: Semaglutide-Weight Management SMARTSIG:2.4 Milligram(s) SUB-Q Once a Week     Review of systems negative except as noted in HPI / PMHx or noted below:  Review of Systems  Constitutional: Negative.   HENT: Negative.    Eyes: Negative.   Respiratory: Negative.    Cardiovascular: Negative.   Gastrointestinal: Negative.   Genitourinary: Negative.   Musculoskeletal: Negative.   Skin: Negative.   Neurological: Negative.   Endo/Heme/Allergies: Negative.   Psychiatric/Behavioral: Negative.      Family History  Problem Relation Age of Onset  Diabetes Mother    Hypertension Mother    Hyperlipidemia Mother    Thyroid disease Mother    Sleep apnea Mother    Obesity Mother    Asthma Mother    COPD Mother    Diabetes Father    Hyperlipidemia Father    Hypertension Father    Heart disease Father    Sudden death Father    Sleep apnea Father    Obesity Father    Asthma Brother    Depression Brother    Diabetes Brother    Hyperlipidemia Brother    Hypertension  Brother    Asthma Maternal Grandmother    Diabetes Maternal Grandmother    Kidney disease Maternal Grandmother    Hyperlipidemia Maternal Grandfather    Hypertension Maternal Grandfather    Kidney disease Maternal Grandfather    Diabetes Paternal Grandmother    Diabetes Paternal Grandfather    Allergy (severe) Son        Animator and bees   Allergic rhinitis Daughter    Allergy (severe) Daughter        Food allergy    Social History   Socioeconomic History   Marital status: Married    Spouse name: Alex Baham   Number of children: 2   Years of education: Not on file   Highest education level: Not on file  Occupational History   Occupation: Stay at home mom  Tobacco Use   Smoking status: Never   Smokeless tobacco: Never  Vaping Use   Vaping status: Never Used  Substance and Sexual Activity   Alcohol use: Yes   Drug use: No   Sexual activity: Yes    Birth control/protection: None  Other Topics Concern   Not on file  Social History Narrative   Not on file   Social Drivers of Health   Financial Resource Strain: Low Risk  (11/21/2022)   Received from [facility]   Overall Financial Resource Strain (CARDIA)    Difficulty of Paying Living Expenses: Not hard at all  Food Insecurity: No Food Insecurity (11/21/2022)   Received from [facility]   Hunger Vital Sign    Worried About Running Out of Food in the Last Year: Never true    Ran Out of Food in the Last Year: Never true  Transportation Needs: No Transportation Needs (11/21/2022)   Received from [facility] - Transportation    Lack of Transportation (Medical): No    Lack of Transportation (Non-Medical): No  Physical Activity: Sufficiently Active (11/21/2022)   Received from [facility]   Exercise Vital Sign    Days of Exercise per Week: 7 days    Minutes of Exercise per Session: 150+ min  Stress: No Stress Concern Present (11/21/2022)   Received from [facility] - Occupational Stress Questionnaire    Feeling of Stress : Not at all  Social Connections: Socially Integrated (11/21/2022)   Received from [facility]   Social Network    How would you rate your social network (family, work, friends)?: Good participation with social networks  Intimate Partner Violence: Not At Risk (11/21/2022)   Received from [facility]   HITS    Over the last 12 months how often did your partner physically hurt you?: Never    Over the last 12 months how often did your partner insult you or talk down to you?: Never    Over the last 12 months how often  did your partner threaten you with physical harm?: Never    Over the last 12 months how often did your partner scream or curse at you?: Never    Environmental and Social history  Lives in a camper with a dry environment, dogs and cats located inside the household, carpet in the bedroom, no plastic on the bed, no plastic on the pillow, no smoking ongoing with inside the household.  Objective:   Vitals:   06/10/23 0857  BP: 102/78  Pulse: 80  Resp: 16  SpO2: 99%   Height: 5\' 1"  (154.9 cm) Weight: 143 lb 12.8 oz (65.2 kg)  Physical Exam Constitutional:      Appearance: She is not diaphoretic.  HENT:     Head: Normocephalic.     Right Ear: Tympanic membrane, ear canal and external ear normal.     Left Ear: Tympanic membrane, ear canal and external ear normal.     Nose: Nose normal. No mucosal edema or rhinorrhea.     Mouth/Throat:     Pharynx: Uvula midline. No oropharyngeal exudate.  Eyes:     Conjunctiva/sclera: Conjunctivae normal.  Neck:     Thyroid: No thyromegaly.     Trachea: Trachea normal. No tracheal tenderness or tracheal deviation.  Cardiovascular:     Rate and Rhythm: Normal rate and regular rhythm.     Heart sounds: Normal heart sounds, S1 normal and S2 normal. No murmur heard. Pulmonary:     Effort: No respiratory distress.     Breath sounds: Normal breath sounds. No stridor. No  wheezing or rales.  Lymphadenopathy:     Head:     Right side of head: No tonsillar adenopathy.     Left side of head: No tonsillar adenopathy.     Cervical: No cervical adenopathy.  Skin:    Findings: No erythema or rash.     Nails: There is no clubbing.  Neurological:     Mental Status: She is alert.     Diagnostics: Allergy skin tests were not performed.   Results of blood tests obtained 14 October 2022 identified WBC 6.2, absolute eosinophil 100, absolute basophil 0, absolute lymphocyte 1600, hemoglobin 13.2, platelet 245, creatinine 0.56 Mg/DL.  Results of blood tests obtained 08 October 2022 identified creatinine 0.60 Mg/DL, AST 64Q/I, ALT 34V/Q  Results of a chest x-ray obtained 14 October 2022 identified the following:  The heart size and mediastinal contours are normal. The lungs are  clear. There is no pleural effusion or pneumothorax. No acute  osseous findings are identified. Early degenerative changes in the  spine. Telemetry leads overlie the chest.   Assessment and Plan:    1. Anaphylaxis due to hymenoptera venom, accidental or unintentional, subsequent encounter   2. Allergy with anaphylaxis due to food   3. Bilateral hand swelling   4. Stiffness of hand joint, unspecified laterality    1. Fire Economist. Foods???  2. Epi-pen, benadryl, MD/ER evaluation for allergic reaction  3. Blood - fire ant IgE, Banana IgE, Shellfish IgE panel, chocolate IgE, CBC w/d, CMP, TSH, FT4, thyroid peroxidase ab, tryptase, SED, ANA w/R, RF, CCP  4. Influenza = Tamiflu, Covid = Paxlovid  5. Immunotherapy???  Camira had a history very consistent with anaphylaxis after exposure of fire ant venom and we will work through that issue by checking an IgE titer directed against fire ant and most likely starting her on a course of immunotherapy at some point in the near future.  She also has a  history of developing an allergic reaction that may be related to the consumption of specific  foods and we will check IgE antibodies directed against banana and shellfish and chocolate.  She also has bilateral hand stiffness and swelling and will obtain some screening blood test looking for an inflammatory disease giving rise to that issue.  I will contact her with the results of her blood test once they are available for review.  Jessica Priest, MD Allergy / Immunology  [facility]

## 2023-06-10 NOTE — Patient Instructions (Addendum)
  1. Counsellor. Foods???  2. Epi-pen, benadryl, MD/ER evaluation for allergic reaction  3. Blood - fire ant IgE, Banana IgE, Shellfish IgE panel, chocolate IgE, CBC w/d, CMP, TSH, FT4, thyroid peroxidase ab, tryptase, SED, ANA w/R, RF, CCP  4. Influenza = Tamiflu, Covid = Paxlovid  5. Immunotherapy???

## 2023-06-11 ENCOUNTER — Encounter: Payer: Self-pay | Admitting: Allergy and Immunology

## 2023-06-14 LAB — ALLERGEN CHOCOLATE: Chocolate/Cacao IgE: <0.1 kU/L

## 2023-06-16 LAB — COMPREHENSIVE METABOLIC PANEL
ALT: 8 [IU]/L (ref 0–32)
AST: 14 [IU]/L (ref 0–40)
Albumin: 4.4 g/dL (ref 3.9–4.9)
Alkaline Phosphatase: 79 [IU]/L (ref 44–121)
BUN/Creatinine Ratio: 12 (ref 9–23)
BUN: 7 mg/dL (ref 6–20)
Bilirubin Total: 0.6 mg/dL (ref 0.0–1.2)
CO2: 22 mmol/L (ref 20–29)
Calcium: 9.1 mg/dL (ref 8.7–10.2)
Chloride: 106 mmol/L (ref 96–106)
Creatinine, Ser: 0.59 mg/dL (ref 0.57–1.00)
Globulin, Total: 2.1 g/dL (ref 1.5–4.5)
Glucose: 81 mg/dL (ref 70–99)
Potassium: 4.2 mmol/L (ref 3.5–5.2)
Sodium: 142 mmol/L (ref 134–144)
Total Protein: 6.5 g/dL (ref 6.0–8.5)
eGFR: 120 mL/min/{1.73_m2} (ref >59)

## 2023-06-16 LAB — CBC WITH DIFFERENTIAL/PLATELET
Basophils Absolute: 0.1 10*3/uL (ref 0.0–0.2)
Basos: 1 %
EOS (ABSOLUTE): 0.1 10*3/uL (ref 0.0–0.4)
Eos: 1 %
Hematocrit: 41.5 % (ref 34.0–46.6)
Hemoglobin: 13.5 g/dL (ref 11.1–15.9)
Immature Grans (Abs): 0 10*3/uL (ref 0.0–0.1)
Immature Granulocytes: 0 %
Lymphocytes Absolute: 2.1 10*3/uL (ref 0.7–3.1)
Lymphs: 32 %
MCH: 28.8 pg (ref 26.6–33.0)
MCHC: 32.5 g/dL (ref 31.5–35.7)
MCV: 89 fL (ref 79–97)
Monocytes Absolute: 0.5 10*3/uL (ref 0.1–0.9)
Monocytes: 8 %
Neutrophils Absolute: 3.8 10*3/uL (ref 1.4–7.0)
Neutrophils: 58 %
Platelets: 231 10*3/uL (ref 150–450)
RBC: 4.68 x10E6/uL (ref 3.77–5.28)
RDW: 12.2 % (ref 11.7–15.4)
WBC: 6.5 10*3/uL (ref 3.4–10.8)

## 2023-06-16 LAB — ALLERGEN PROFILE, SHELLFISH
Clam IgE: <0.1 kU/L
F023-IgE Crab: <0.1 kU/L
F080-IgE Lobster: <0.1 kU/L
F290-IgE Oyster: <0.1 kU/L
Scallop IgE: <0.1 kU/L
Shrimp IgE: <0.1 kU/L

## 2023-06-16 LAB — TSH+FREE T4
Free T4: 1.27 ng/dL (ref 0.82–1.77)
TSH: 1.33 u[IU]/mL (ref 0.450–4.500)

## 2023-06-16 LAB — TRYPTASE: Tryptase: 3.4 ug/L (ref 2.2–13.2)

## 2023-06-16 LAB — CYCLIC CITRUL PEPTIDE ANTIBODY, IGG/IGA: Cyclic Citrullin Peptide Ab: 11 U (ref 0–19)

## 2023-06-16 LAB — SEDIMENTATION RATE: Sed Rate: 2 mm/h (ref 0–32)

## 2023-06-16 LAB — THYROID PEROXIDASE ANTIBODY: Thyroperoxidase Ab SerPl-aCnc: 16 [IU]/mL (ref 0–34)

## 2023-06-16 LAB — RHEUMATOID FACTOR: Rheumatoid fact SerPl-aCnc: 18.1 [IU]/mL — ABNORMAL HIGH (ref <14.0)

## 2023-06-16 LAB — ALLERGEN FIRE ANT: I070-IgE Fire Ant (Invicta): 1.34 kU/L — AB

## 2023-06-16 LAB — ANA W/REFLEX: Anti Nuclear Antibody (ANA): NEGATIVE

## 2023-06-16 LAB — ALLERGEN BANANA: Allergen Banana IgE: <0.1 kU/L

## 2023-11-12 ENCOUNTER — Ambulatory Visit
Admission: EM | Admit: 2023-11-12 | Discharge: 2023-11-12 | Disposition: A | Discharge status: Home / Self Care | Attending: Physician Assistant | Admitting: Physician Assistant

## 2023-11-12 DIAGNOSIS — Z973 Presence of spectacles and contact lenses: Secondary | ICD-10-CM

## 2023-11-12 DIAGNOSIS — L255 Unspecified contact dermatitis due to plants, except food: Secondary | ICD-10-CM | POA: Diagnosis not present

## 2023-11-12 DIAGNOSIS — H1032 Unspecified acute conjunctivitis, left eye: Secondary | ICD-10-CM

## 2023-11-12 MED ORDER — OFLOXACIN 0.3 % OP SOLN
1.0000 [drp] | Freq: Four times a day (QID) | OPHTHALMIC | 0 refills | Status: AC
Start: 2023-11-12 — End: ?

## 2023-11-12 MED ORDER — PREDNISONE 10 MG (21) PO TBPK: ORAL_TABLET | Freq: Every day | ORAL | 0 refills | Status: AC

## 2023-11-12 MED ORDER — METHYLPREDNISOLONE SODIUM SUCC 125 MG IJ SOLR: 80.0000 mg | Freq: Once | INTRAMUSCULAR | Status: DC

## 2023-11-12 MED ORDER — TRIAMCINOLONE ACETONIDE 0.1 % EX CREA: 1.0000 | TOPICAL_CREAM | Freq: Two times a day (BID) | CUTANEOUS | 0 refills | Status: AC

## 2023-11-12 MED ORDER — METHYLPREDNISOLONE SODIUM SUCC 125 MG IJ SOLR
80.0000 mg | Freq: Once | INTRAMUSCULAR | Status: AC
  Administered 2023-11-12: 80 mg via INTRAMUSCULAR

## 2023-11-12 MED ORDER — METHYLPREDNISOLONE ACETATE 80 MG/ML IJ SUSP: 80.0000 mg | Freq: Once | INTRAMUSCULAR | Status: DC

## 2023-11-12 NOTE — ED Triage Notes (Signed)
 Pt present with poison ivy on both legs and has spread to the lt eye. States she mows a lot and does not know exactly when she was exposed to poison ivy.

## 2023-11-12 NOTE — ED Provider Notes (Signed)
 [facility]    CSN: [id number] Arrival date & time: 11/12/23  0801      History   Chief Complaint Chief Complaint  Patient presents with   Poison Ivy    HPI Alison Myers is a 38 y.o. female.   Patient presents today with a 3-day history of widespread pruritic rash.  She reports that this began on her right ankle and then spread to involve both of her legs.  She woke up this morning feeling that it was near her left eye prompting evaluation.  She reports that her left eye has been red and she has some swelling and irritation in the periorbital region.  She typically wears contacts but took these off and is now wearing glasses.  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  She has had poison ivy with similar presentation in the past.  Denies any fever, nausea, vomiting.  Denies any recent steroid use.  She has no chance of pregnancy and she has an IUD in place.  She does not have a history of diabetes.    Past Medical History:  Diagnosis Date   Atrial tachycardia (HCC)    Atrial tachycardia (HCC) 38 years old    Patient Active Problem List   Diagnosis Date Noted   Obesity (BMI 30.0-34.9) 04/02/2018   Weight loss counseling, encounter for 04/02/2018   Unable to lose weight 03/05/2018   Insulin  resistance 03/05/2018   Acute gastric ulcer 03/05/2018   Generalized abdominal pain 03/05/2018   Other fatigue 09/03/2017   Shortness of breath on exertion 09/03/2017   Atrial tachycardia (HCC) 09/03/2017   Tachycardia 12/13/2015   Inappropriate sinus tachycardia (HCC) 11/30/2015   Herpes simplex virus type 2 (HSV-2) infection affecting pregnancy in third trimester, antepartum 11/25/2015   Current use of beta blocker 09/08/2015   pregnancy 08/11/2015   H/O sinus tachycardia 08/11/2015   daughter with Hyper IgE syndrome 08/11/2015   Family history of myocardial infarction at age less than 60  08/11/2015   Family history of congenital heart defect 08/11/2015   Chest pain 07/18/2007   Other specified cardiac arrhythmias 04/30/2007   Palpitations 04/29/2007    Past Surgical History:  Procedure Laterality Date   ATRIAL ABLATION SURGERY  2009/2012   BONE GRAFT HIP ILIAC CREST Left 2000   heart ablation  2009 2012    OB History     Gravida  2   Para  2   Term  2   Preterm      AB      Living  2      SAB      IAB      Ectopic      Multiple      Live Births  2            Home Medications    Prior to Admission medications   Medication Sig Start Date End Date Taking? Authorizing Provider  ofloxacin (OCUFLOX) 0.3 % ophthalmic solution Place 1 drop into the left eye 4 (four) times daily. 11/12/23  Yes Tyronn Golda K, PA-C  predniSONE  (STERAPRED UNI-PAK 21 TAB) 10 MG (21) TBPK tablet Take by mouth daily. Take 6 tabs by mouth daily  for 2 days, then 5 tabs for 2 days, then 4 tabs for 2 days, then 3 tabs for 2 days, 2 tabs for 2 days, then 1 tab by  mouth daily for 2 days 11/12/23  Yes Dagan Heinz K, PA-C  triamcinolone  cream (KENALOG ) 0.1 % Apply 1 Application topically 2 (two) times daily. 11/12/23  Yes Floyd Wade K, PA-C  EPINEPHrine 0.3 mg/0.3 mL IJ SOAJ injection Inject into the muscle. 03/18/23   [provider]  levonorgestrel  (MIRENA ) 20 MCG/DAY IUD by Intrauterine route.    [provider]  WEGOVY 2.4 MG/0.75ML SOAJ SMARTSIG:2.4 Milligram(s) SUB-Q Once a Week    [provider]    Family History Family History  Problem Relation Age of Onset   Diabetes Mother    Hypertension Mother    Hyperlipidemia Mother    Thyroid  disease Mother    Sleep apnea Mother    Obesity Mother    Asthma Mother    COPD Mother    Diabetes Father    Hyperlipidemia Father    Hypertension Father    Heart disease Father    Sudden death Father    Sleep apnea Father    Obesity Father    Asthma Brother    Depression Brother    Diabetes  Brother    Hyperlipidemia Brother    Hypertension Brother    Asthma Maternal Grandmother    Diabetes Maternal Grandmother    Kidney disease Maternal Grandmother    Hyperlipidemia Maternal Grandfather    Hypertension Maternal Grandfather    Kidney disease Maternal Grandfather    Diabetes Paternal Grandmother    Diabetes Paternal Grandfather    Allergy (severe) Son        Fire  ant and bees   Allergic rhinitis Daughter    Allergy (severe) Daughter        Food allergy    Social History Social History   Tobacco Use   Smoking status: Never   Smokeless tobacco: Never  Vaping Use   Vaping status: Never Used  Substance Use Topics   Alcohol use: Yes   Drug use: No     Allergies   Demerol [meperidine]   Review of Systems Review of Systems  Constitutional:  Positive for activity change. Negative for appetite change, fatigue and fever.  Eyes:  Positive for redness. Negative for photophobia, pain, discharge, itching and visual disturbance.  Gastrointestinal:  Negative for abdominal pain, diarrhea, nausea and vomiting.  Skin:  Positive for rash. Negative for wound.     Physical Exam Triage Vital Signs ED Triage Vitals  Encounter Vitals Group     BP 11/12/23 0819 111/79     Systolic BP Percentile --      Diastolic BP Percentile --      Pulse Rate 11/12/23 0819 95     Resp 11/12/23 0819 17     Temp 11/12/23 0819 98.1 F (36.7 C)     Temp Source 11/12/23 0819 Oral     SpO2 11/12/23 0819 98 %     Weight --      Height --      Head Circumference --      Peak Flow --      Pain Score 11/12/23 0817 5     Pain Loc --      Pain Education --      Exclude from Growth Chart --    No data found.  Updated Vital Signs BP 111/79 (BP Location: Left Arm)   Pulse 95   Temp 98.1 F (36.7 C) (Oral)   Resp 17   LMP  (LMP Unknown)   SpO2 98%   Breastfeeding No   Visual Acuity  Right Eye Distance:   Left Eye Distance:   Bilateral Distance:    Right Eye Near:   Left Eye  Near:    Bilateral Near:     Physical Exam Vitals reviewed.  Constitutional:      General: She is awake. She is not in acute distress.    Appearance: Normal appearance. She is well-developed. She is not ill-appearing.     Comments: Very pleasant female appears stated age in no acute distress sitting comfortably in exam room  HENT:     Head: Normocephalic and atraumatic.  Eyes:     Extraocular Movements: Extraocular movements intact.     Conjunctiva/sclera:     Right eye: Right conjunctiva is not injected. No chemosis.    Left eye: Left conjunctiva is injected. No chemosis. Cardiovascular:     Rate and Rhythm: Normal rate and regular rhythm.     Heart sounds: Normal heart sounds, S1 normal and S2 normal. No murmur heard. Pulmonary:     Effort: Pulmonary effort is normal.     Breath sounds: Normal breath sounds. No wheezing, rhonchi or rales.     Comments: Clear to auscultation bilaterally Skin:    Findings: Rash present. Rash is macular and papular.     Comments: Maculopapular rash noted right ankle with spread involving her upper legs bilaterally as well as several lesions around her left eye with associated swelling.  Lesions on her posterior left leg and right leg have surrounding ecchymosis.  Psychiatric:        Behavior: Behavior is cooperative.      UC Treatments / Results  Labs (all labs ordered are listed, but only abnormal results are displayed) Labs Reviewed - No data to display  EKG   Radiology No results found.  Procedures Procedures (including critical care time)  Medications Ordered in UC Medications  methylPREDNISolone  sodium succinate (SOLU-MEDROL ) 125 mg/2 mL injection 80 mg (80 mg Intramuscular Given 11/12/23 0841)    Initial Impression / Assessment and Plan / UC Course  I have reviewed the triage vital signs and the nursing notes.  Pertinent labs & imaging results that were available during my care of the patient were reviewed by me and  considered in my medical decision making (see chart for details).     Patient is well-appearing, afebrile, nontoxic, nontachycardic.  Concern for Rhus dermatitis given clinical presentation.  Given spread of symptoms to involve the face will treat with 80 mg of Solu-Medrol  in clinic and start prednisone  taper tomorrow (11/13/2023).  We discussed that she is not to take NSAIDs with this medication and risk of GI bleeding but can use Tylenol  as needed.  She is to keep the area clean with soap and water to avoid secondary infection.  She was prescribed triamcinolone  to be applied to specific bothersome areas.  I suspect that the redness of her eye is related to irritation from spreading contact dermatitis, however, since she does wear contacts I did prescribed ofloxacin to be used to cover for bacterial conjunctivitis and a contact lens user.  We discussed that if anything worsens or changes she should be seen immediately.  Strict return precautions given.  Excuse note provided.  Final Clinical Impressions(s) / UC Diagnoses   Final diagnoses:  Rhus dermatitis  Acute bacterial conjunctivitis of left eye  Uses contact lenses     Discharge Instructions      We gave an injection of steroids today.  Please start prednisone  tomorrow (11/13/2023).  Do not take NSAIDs  with this medication including aspirin , ibuprofen /Advil , naproxen/Aleve.  You can use Tylenol  as needed.  Apply triamcinolone  to specific areas that are bothersome.  Keep the area clean to prevent secondary infection.  I have also called in an eyedrop to cover for an infection in your eye.  Wash your hands before handling your medication and do not touch the medication bottle to the eye as this can cause contamination of the medicine.  Do not use your contacts until your symptoms resolve.  If anything changes or worsens please return for reevaluation.   ED Prescriptions     Medication Sig Dispense Auth. Provider   predniSONE  (STERAPRED  UNI-PAK 21 TAB) 10 MG (21) TBPK tablet Take by mouth daily. Take 6 tabs by mouth daily  for 2 days, then 5 tabs for 2 days, then 4 tabs for 2 days, then 3 tabs for 2 days, 2 tabs for 2 days, then 1 tab by mouth daily for 2 days 42 tablet Jodell Weitman K, PA-C   ofloxacin (OCUFLOX) 0.3 % ophthalmic solution Place 1 drop into the left eye 4 (four) times daily. 5 mL Sterling Ucci K, PA-C   triamcinolone  cream (KENALOG ) 0.1 % Apply 1 Application topically 2 (two) times daily. 90 g Shaneese Tait K, PA-C      PDMP not reviewed this encounter.   Budd Cargo, PA-C 11/12/23 [phone]

## 2023-11-12 NOTE — Discharge Instructions (Signed)
 We gave an injection of steroids today.  Please start prednisone  tomorrow (11/13/2023).  Do not take NSAIDs with this medication including aspirin , ibuprofen /Advil , naproxen/Aleve.  You can use Tylenol  as needed.  Apply triamcinolone  to specific areas that are bothersome.  Keep the area clean to prevent secondary infection.  I have also called in an eyedrop to cover for an infection in your eye.  Wash your hands before handling your medication and do not touch the medication bottle to the eye as this can cause contamination of the medicine.  Do not use your contacts until your symptoms resolve.  If anything changes or worsens please return for reevaluation.
